# Patient Record
Sex: Female | Born: 1955 | Race: White | Hispanic: No | Marital: Married | Smoking: Former smoker
Health system: Southern US, Community
[De-identification: ages and names within clinical notes are randomized; demographics above are authoritative.]

## PROBLEM LIST (undated history)

## (undated) DIAGNOSIS — A498 Other bacterial infections of unspecified site: Secondary | ICD-10-CM

## (undated) DIAGNOSIS — A0472 Enterocolitis due to Clostridium difficile, not specified as recurrent: Secondary | ICD-10-CM

## (undated) HISTORY — DX: Other bacterial infections of unspecified site: A49.8

## (undated) HISTORY — PX: DILATION AND CURETTAGE OF UTERUS: SHX78

## (undated) HISTORY — DX: Enterocolitis due to Clostridium difficile, not specified as recurrent: A04.72

## (undated) HISTORY — PX: BREAST ENHANCEMENT SURGERY: SHX7

---

## 1998-02-16 ENCOUNTER — Other Ambulatory Visit: Admission: RE | Admit: 1998-02-16 | Discharge: 1998-02-16 | Payer: Self-pay | Admitting: Obstetrics and Gynecology

## 1999-12-28 ENCOUNTER — Other Ambulatory Visit: Admission: RE | Admit: 1999-12-28 | Discharge: 1999-12-28 | Payer: Self-pay | Admitting: Obstetrics and Gynecology

## 2001-01-01 ENCOUNTER — Other Ambulatory Visit: Admission: RE | Admit: 2001-01-01 | Discharge: 2001-01-01 | Payer: Self-pay | Admitting: Obstetrics and Gynecology

## 2001-03-21 ENCOUNTER — Encounter: Payer: Self-pay | Admitting: Internal Medicine

## 2001-03-21 ENCOUNTER — Encounter: Admission: RE | Admit: 2001-03-21 | Discharge: 2001-03-21 | Payer: Self-pay | Admitting: Internal Medicine

## 2002-12-03 ENCOUNTER — Other Ambulatory Visit: Admission: RE | Admit: 2002-12-03 | Discharge: 2002-12-03 | Payer: Self-pay | Admitting: Obstetrics and Gynecology

## 2002-12-13 ENCOUNTER — Encounter: Admission: RE | Admit: 2002-12-13 | Discharge: 2002-12-13 | Payer: Self-pay | Admitting: Sports Medicine

## 2004-03-03 ENCOUNTER — Ambulatory Visit (HOSPITAL_BASED_OUTPATIENT_CLINIC_OR_DEPARTMENT_OTHER): Admission: RE | Admit: 2004-03-03 | Discharge: 2004-03-03 | Payer: Self-pay | Admitting: Obstetrics and Gynecology

## 2004-03-03 ENCOUNTER — Encounter (INDEPENDENT_AMBULATORY_CARE_PROVIDER_SITE_OTHER): Payer: Self-pay | Admitting: Specialist

## 2004-03-03 ENCOUNTER — Ambulatory Visit (HOSPITAL_COMMUNITY): Admission: RE | Admit: 2004-03-03 | Discharge: 2004-03-03 | Payer: Self-pay | Admitting: Obstetrics and Gynecology

## 2004-11-29 ENCOUNTER — Encounter: Admission: RE | Admit: 2004-11-29 | Discharge: 2004-11-29 | Payer: Self-pay | Admitting: Obstetrics and Gynecology

## 2004-12-13 ENCOUNTER — Encounter: Admission: RE | Admit: 2004-12-13 | Discharge: 2004-12-13 | Payer: Self-pay | Admitting: Obstetrics and Gynecology

## 2005-02-14 HISTORY — PX: COLONOSCOPY: SHX174

## 2005-11-30 ENCOUNTER — Encounter: Admission: RE | Admit: 2005-11-30 | Discharge: 2005-11-30 | Payer: Self-pay | Admitting: Obstetrics and Gynecology

## 2006-12-13 ENCOUNTER — Encounter: Admission: RE | Admit: 2006-12-13 | Discharge: 2006-12-13 | Payer: Self-pay | Admitting: Obstetrics and Gynecology

## 2007-12-21 ENCOUNTER — Encounter: Admission: RE | Admit: 2007-12-21 | Discharge: 2007-12-21 | Payer: Self-pay | Admitting: Obstetrics and Gynecology

## 2008-08-02 ENCOUNTER — Emergency Department (HOSPITAL_COMMUNITY): Admission: EM | Admit: 2008-08-02 | Discharge: 2008-08-02 | Payer: Self-pay | Admitting: Family Medicine

## 2008-12-03 ENCOUNTER — Encounter: Admission: RE | Admit: 2008-12-03 | Discharge: 2008-12-03 | Payer: Self-pay | Admitting: Obstetrics and Gynecology

## 2009-12-04 ENCOUNTER — Encounter: Admission: RE | Admit: 2009-12-04 | Discharge: 2009-12-04 | Payer: Self-pay | Admitting: Obstetrics and Gynecology

## 2010-03-06 ENCOUNTER — Encounter: Payer: Self-pay | Admitting: Obstetrics and Gynecology

## 2010-07-02 NOTE — Op Note (Signed)
NAMELONDEN, Jacqueline Macias               ACCOUNT NO.:  [id number]   MEDICAL RECORD NO.:  [id number]          PATIENT TYPE:  AMB   LOCATION:  [facility]                         FACILITY:  [facility]   PHYSICIAN:  Sherry A. Dickstein, M.D.DATE OF BIRTH:  01-08-1956   DATE OF PROCEDURE:  03/03/2004  DATE OF DISCHARGE:                                 OPERATIVE REPORT   PREOPERATIVE DIAGNOSES:  1.  Menorrhagia.  2.  Endometrial polyps.   POSTOPERATIVE DIAGNOSES:  1.  Menorrhagia.  2.  Endometrial polyps.   PROCEDURE:  D&C hysteroscopy with resectoscope.   SURGEON:  Dr. Cordelia Pen A. Dickstein.   ANESTHESIA:  MAC.   INDICATIONS:  This is a 55 year old G2, P2-0-1-2 woman who has menstrual  periods every 30 days lasting 6 to 7 days with excessively heavy flow. The  patient changes two super tampons every two hours for two fill days. The  patient was found to be anemic at the time of her last exam with a  hemoglobin of 9.1. Ultrasound was performed revealing thickened endometrium  with hyperechoic masses within the endometrial cavity. Because of this, the  patient was brought to the operating room for [facility] hysteroscopy with  resectoscope.   FINDINGS:  Normal sized anteflexed uterus. No adnexa mass. Multiple polyps  in the endometrial cavity.   PROCEDURE:  The patient was brought into the operating room and given  adequate IV sedation. She was placed in the dorsal lithotomy position. Her  perineum and vagina were washed with Hibiclens. The patient was draped in a  sterile fashion. Specimen was placed within the vagina. Paracervical block  was administered with 1% Nesacaine. Anterior lip of the cervix was grasped  with a single-toothed tenaculum. Cervix was sounded and dilated with Pratt  dilators to a #31. Hysteroscope was easily introduced into the endometrial  cavity. Pictures were obtained. Using a double loop right angle resector,  all endometrial polyps were removed circumferentially. Then,  endometrial  resections were taken circumferentially. Adequate hemostasis was present  after small areas were cauterized. Pictures were obtained. All instruments  were removed from the vagina. The patient was taken out of the dorsal  lithotomy position. She was awakened. She was moved from the operating table  to a stretcher in stable condition. Complications were none. Estimated blood  loss less than 5 cc. Sorbitol differential minus 70 cc.   SPECIMENS:  1.  Endometrial polyps.  2.  Endometrial resections.     Sher   SAD/MEDQ  D:  03/03/2004  T:  03/03/2004  Job:  [id number]

## 2010-12-03 ENCOUNTER — Other Ambulatory Visit: Payer: Self-pay | Admitting: Obstetrics and Gynecology

## 2010-12-03 DIAGNOSIS — Z1231 Encounter for screening mammogram for malignant neoplasm of breast: Secondary | ICD-10-CM

## 2010-12-20 ENCOUNTER — Ambulatory Visit: Payer: Self-pay

## 2010-12-22 ENCOUNTER — Ambulatory Visit
Admission: RE | Admit: 2010-12-22 | Discharge: 2010-12-22 | Disposition: A | Discharge status: Home / Self Care | Payer: PRIVATE HEALTH INSURANCE | Source: Ambulatory Visit | Attending: Obstetrics and Gynecology | Admitting: Obstetrics and Gynecology

## 2010-12-22 DIAGNOSIS — Z1231 Encounter for screening mammogram for malignant neoplasm of breast: Secondary | ICD-10-CM

## 2011-01-03 ENCOUNTER — Other Ambulatory Visit: Payer: Self-pay | Admitting: Obstetrics and Gynecology

## 2011-01-03 DIAGNOSIS — R928 Other abnormal and inconclusive findings on diagnostic imaging of breast: Secondary | ICD-10-CM

## 2011-01-17 ENCOUNTER — Ambulatory Visit
Admission: RE | Admit: 2011-01-17 | Discharge: 2011-01-17 | Disposition: A | Discharge status: Home / Self Care | Payer: PRIVATE HEALTH INSURANCE | Source: Ambulatory Visit | Attending: Obstetrics and Gynecology | Admitting: Obstetrics and Gynecology

## 2011-01-17 DIAGNOSIS — R928 Other abnormal and inconclusive findings on diagnostic imaging of breast: Secondary | ICD-10-CM

## 2011-02-02 ENCOUNTER — Other Ambulatory Visit: Payer: Self-pay | Admitting: *Deleted

## 2011-02-03 ENCOUNTER — Other Ambulatory Visit: Payer: Self-pay | Admitting: Internal Medicine

## 2011-02-03 ENCOUNTER — Ambulatory Visit
Admission: RE | Admit: 2011-02-03 | Discharge: 2011-02-03 | Disposition: A | Discharge status: Home / Self Care | Payer: PRIVATE HEALTH INSURANCE | Source: Ambulatory Visit | Attending: *Deleted | Admitting: *Deleted

## 2011-02-03 DIAGNOSIS — K769 Liver disease, unspecified: Secondary | ICD-10-CM

## 2011-02-04 ENCOUNTER — Ambulatory Visit
Admission: RE | Admit: 2011-02-04 | Discharge: 2011-02-04 | Disposition: A | Discharge status: Home / Self Care | Payer: PRIVATE HEALTH INSURANCE | Source: Ambulatory Visit | Attending: Internal Medicine | Admitting: Internal Medicine

## 2011-02-04 DIAGNOSIS — K769 Liver disease, unspecified: Secondary | ICD-10-CM

## 2011-02-04 MED ORDER — GADOBENATE DIMEGLUMINE 529 MG/ML IV SOLN
10.0000 mL | Freq: Once | INTRAVENOUS | Status: AC | PRN
  Administered 2011-02-04: 10 mL via INTRAVENOUS

## 2011-02-05 ENCOUNTER — Other Ambulatory Visit: Payer: PRIVATE HEALTH INSURANCE

## 2011-11-25 ENCOUNTER — Other Ambulatory Visit: Payer: Self-pay | Admitting: Obstetrics and Gynecology

## 2011-11-25 DIAGNOSIS — Z1231 Encounter for screening mammogram for malignant neoplasm of breast: Secondary | ICD-10-CM

## 2011-11-25 DIAGNOSIS — Z9882 Breast implant status: Secondary | ICD-10-CM

## 2011-12-09 ENCOUNTER — Other Ambulatory Visit: Payer: Self-pay | Admitting: Obstetrics and Gynecology

## 2011-12-09 DIAGNOSIS — Z780 Asymptomatic menopausal state: Secondary | ICD-10-CM

## 2011-12-26 ENCOUNTER — Ambulatory Visit
Admission: RE | Admit: 2011-12-26 | Discharge: 2011-12-26 | Disposition: A | Discharge status: Home / Self Care | Payer: Self-pay | Source: Ambulatory Visit | Attending: Obstetrics and Gynecology | Admitting: Obstetrics and Gynecology

## 2011-12-26 DIAGNOSIS — Z9882 Breast implant status: Secondary | ICD-10-CM

## 2011-12-26 DIAGNOSIS — Z1231 Encounter for screening mammogram for malignant neoplasm of breast: Secondary | ICD-10-CM

## 2011-12-26 DIAGNOSIS — Z780 Asymptomatic menopausal state: Secondary | ICD-10-CM

## 2012-10-29 ENCOUNTER — Other Ambulatory Visit: Payer: Self-pay

## 2012-10-29 DIAGNOSIS — Z1231 Encounter for screening mammogram for malignant neoplasm of breast: Secondary | ICD-10-CM

## 2012-12-17 ENCOUNTER — Ambulatory Visit
Admission: RE | Admit: 2012-12-17 | Discharge: 2012-12-17 | Disposition: A | Discharge status: Home / Self Care | Payer: PRIVATE HEALTH INSURANCE | Source: Ambulatory Visit

## 2012-12-17 DIAGNOSIS — Z1231 Encounter for screening mammogram for malignant neoplasm of breast: Secondary | ICD-10-CM

## 2013-11-08 ENCOUNTER — Other Ambulatory Visit: Payer: Self-pay | Admitting: Obstetrics and Gynecology

## 2013-11-08 ENCOUNTER — Other Ambulatory Visit: Payer: Self-pay

## 2013-11-08 DIAGNOSIS — Z1231 Encounter for screening mammogram for malignant neoplasm of breast: Secondary | ICD-10-CM

## 2013-11-08 DIAGNOSIS — Z803 Family history of malignant neoplasm of breast: Secondary | ICD-10-CM

## 2013-11-08 DIAGNOSIS — M8580 Other specified disorders of bone density and structure, unspecified site: Secondary | ICD-10-CM

## 2013-12-26 ENCOUNTER — Other Ambulatory Visit: Payer: PRIVATE HEALTH INSURANCE

## 2014-01-15 ENCOUNTER — Ambulatory Visit
Admission: RE | Admit: 2014-01-15 | Discharge: 2014-01-15 | Disposition: A | Discharge status: Home / Self Care | Payer: 59 | Source: Ambulatory Visit

## 2014-01-15 ENCOUNTER — Ambulatory Visit
Admission: RE | Admit: 2014-01-15 | Discharge: 2014-01-15 | Disposition: A | Discharge status: Home / Self Care | Payer: 59 | Source: Ambulatory Visit | Attending: Obstetrics and Gynecology | Admitting: Obstetrics and Gynecology

## 2014-01-15 DIAGNOSIS — M8580 Other specified disorders of bone density and structure, unspecified site: Secondary | ICD-10-CM

## 2014-01-15 DIAGNOSIS — Z1231 Encounter for screening mammogram for malignant neoplasm of breast: Secondary | ICD-10-CM

## 2014-01-15 DIAGNOSIS — Z803 Family history of malignant neoplasm of breast: Secondary | ICD-10-CM

## 2014-10-22 ENCOUNTER — Telehealth: Payer: Self-pay | Admitting: Genetic Counselor

## 2014-10-22 NOTE — Telephone Encounter (Signed)
genetic f/u appt-s/w patient and gave appt for 09/22 @ 2 w/Kayla Joesph July

## 2014-11-06 ENCOUNTER — Encounter: Payer: Self-pay | Admitting: Genetic Counselor

## 2014-11-06 ENCOUNTER — Other Ambulatory Visit: Payer: 59

## 2014-11-06 ENCOUNTER — Ambulatory Visit (HOSPITAL_BASED_OUTPATIENT_CLINIC_OR_DEPARTMENT_OTHER): Payer: 59 | Admitting: Genetic Counselor

## 2014-11-06 DIAGNOSIS — Z803 Family history of malignant neoplasm of breast: Secondary | ICD-10-CM

## 2014-11-06 DIAGNOSIS — Z807 Family history of other malignant neoplasms of lymphoid, hematopoietic and related tissues: Secondary | ICD-10-CM

## 2014-11-06 DIAGNOSIS — Z315 Encounter for genetic counseling: Secondary | ICD-10-CM | POA: Diagnosis not present

## 2014-11-06 NOTE — Progress Notes (Signed)
REFERRING PROVIDER: Avon Gully, NP [place], [place] [address]  PRIMARY PROVIDER:  No primary care provider on file.  PRIMARY REASON FOR VISIT:  1. Family history of breast cancer in mother   2. Family history of breast cancer in female   3. Family history of lymphoma      HISTORY OF PRESENT ILLNESS:   Jacqueline Macias, a 59 y.o. female, was seen for a [place] cancer genetics consultation at the request of her provider due to a maternal family history of breast cancer.  Jacqueline Macias presents to clinic today to discuss the possibility of a hereditary predisposition to cancer, genetic testing, and to further clarify her future cancer risks, as well as potential cancer risks for family members.    Jacqueline Macias is a 59 y.o. female with no personal history of cancer.    CANCER HISTORY:   No history exists.     HORMONAL RISK FACTORS:  Menarche was at age 26.  First live birth at age 50.  OCP use for approximately 0 years.  Ovaries intact: yes.  Hysterectomy: no.  Menopausal status: postmenopausal.  HRT use: 7 years. Colonoscopy: yes; normal. Mammogram within the last year: yes. Number of breast biopsies: 0. Up to date with pelvic exams:  yes. Any excessive radiation exposure in the past:  no  No past medical history on file.  No past surgical history on file.  Social History   Social History  . Marital Status: Married    Spouse Name: N/A  . Number of Children: N/A  . Years of Education: N/A   Social History Main Topics  . Smoking status: Former Smoker -- 0.50 packs/day for 5 years    Types: Cigarettes    Quit date: 02/14/1977  . Smokeless tobacco: Never Used  . Alcohol Use: 2.4 oz/week    4 Glasses of wine per week     Comment: 2 glasses red wine on friday and saturday  . Drug Use: Not on file  . Sexual Activity: Not on file   Other Topics Concern  . Not on file   Social History Narrative  . No narrative on file     FAMILY HISTORY:  We  obtained a detailed, 4-generation family history.  Significant diagnoses are listed below: Family History  Problem Relation Age of Onset  . Breast cancer Mother 78  . Other Mother     TAH in 77s  . Lymphoma Father 77  . Breast cancer Maternal Aunt     dx. 76-78  . Heart attack Maternal Grandfather   . Dementia Paternal Grandmother   . Dementia Paternal Grandfather   . Breast cancer Maternal Aunt 80  . Breast cancer Other     Jacqueline Macias has twin sons, ages 70.  She has four full brothers, one of whom passed away at 51 in a boating accident.  Her other three brothers are currently in their 68s and have never had cancer.  Jacqueline Macias also has several nieces and nephews who have not had cancer to her knowledge.  Jacqueline Macias mother was diagnosed with breast cancer at 14 and she passed away following metastasis at the age of 24.  Jacqueline Macias reports that her mother had a hysterectomy in her 86s, following which she used estrogen.  Jacqueline Macias believes that the estrogen usage may have contributed to her mother's breast cancer.  Jacqueline Macias father was diagnosed with lymphoma at the age of 77 and he passed away at 7.  There is a maternal family history of breast cancer.  Jacqueline Macias mother had two full sisters and one full brother.  Both of Jacqueline Macias maternal aunts had breast cancer.  One aunt was diagnosed at 69 and passed away at 60; she had no children. The other aunt was diagnosed around 48 and passed away at 75.  This aunt had one son and two daughters.  One daughter, who is currently 105, was diagnosed with breast cancer at 29.  Jacqueline Macias reports that this cousin had BRCA testing in within the last 5 years that was negative.  This cousin has one son and one daughter who have not had cancer.  Jacqueline Macias maternal uncle has passed away, but she has not further information for him.  Jacqueline Macias maternal grandmother died at 8 and her grandfather died at 95 of a heart attack.  [name] reports that her grandmother reported a history of breast cancer for her own mother.  Jacqueline Macias father had one full sister who passed away at 42.  Jacqueline Macias reports that this aunt may have had cancer.  Jacqueline Macias has no information for her paternal first cousins.  Her paternal grandparents passed away with dementia in their 43s.  She is unaware of any additional cancer history on this side of the family.  Patient's maternal ancestors are of [place] descent, and paternal ancestors are of Caucasian/German descent. There is no reported Ashkenazi Jewish ancestry. There is no known consanguinity.  GENETIC COUNSELING ASSESSMENT: Jacqueline Macias is a 59 y.o. female with a family history of breast cancer which is somewhat suggestive of a hereditary breast cancer syndrome and predisposition to cancer. We, therefore, discussed and recommended the following at today's visit.   DISCUSSION: We reviewed the characteristics, features and inheritance patterns of hereditary cancer syndromes, particularly those caused by mutations within the BRCA1/2 genes. We also discussed genetic testing, including the appropriate family members to test, the process of testing, insurance coverage and turn-around-time for results. We discussed the implications of a negative, positive and/or variant of uncertain significant result. We recommended [name] pursue genetic testing for the 17-gene BreastNext Panel through [facility] ([place], [place]).  The BreastNext Panel offered by [facility] includes sequencing and deletion/duplication analysis for the following 17 genes: ATM, BARD1, BRCA1, BRCA2, BRIP1, CDH1, CHEK2, MRE11A, MUTYH, NBN, NF1, PALB2, PTEN, RAD50, RAD51C, RAD51D, and TP53.  Based on [name] family history of cancer, she meets medical criteria for genetic testing. Despite that she meets criteria, she may still have an out of pocket cost. We discussed that if her out of pocket cost for  testing is over $100, the laboratory will call and confirm whether she wants to proceed with testing.  If the out of pocket cost of testing is less than $100 she will be billed by the genetic testing laboratory.   Based on the patient's personal and family history, statistical model ([place])  and literature data were used to estimate her risk of developing breast cancer. These estimate her lifetime risk of developing breast cancer to be approximately 27.3%. This estimation does not take into account any genetic testing results.  The patient's lifetime breast cancer risk is a preliminary estimate based on available information using one of several models endorsed by the [place] (ACS). The ACS recommends consideration of breast MRI screening as an adjunct to mammography for patients at high risk (defined as 20% or greater lifetime risk). A more  detailed breast cancer risk assessment can be considered, if clinically indicated.   PLAN: After considering the risks, benefits, and limitations, Ms. Farabaugh  provided informed consent to pursue genetic testing and the blood sample was sent to [facility] for analysis of the 17-gene BreastNext Panel test. Results should be available within approximately 3-4 weeks' time, at which point they will be disclosed by telephone to Ms. Trevathan, as will any additional recommendations warranted by these results. Ms. Rocque will receive a summary of her genetic counseling visit and a copy of her results once available. This information will also be available in Epic. We encouraged Ms. Nash to remain in contact with cancer genetics annually so that we can continuously update the family history and inform her of any changes in cancer genetics and testing that may be of benefit for her family. Ms. Alarid questions were answered to her satisfaction today. Our contact information was provided should additional questions or concerns  arise.  Thank you for the referral and allowing [place] to share in the care of your patient.   Jeanine Luz, MS Genetic Counselor [email or web address] Phone: [phone]  The patient was seen for a total of 60 minutes in face-to-face genetic counseling.  This patient was discussed with Drs. Magrinat, Lindi Adie and/or Burr Medico who agrees with the above.    _______________________________________________________________________ For Office Staff:  Number of people involved in session: 1 Was an Intern/ student involved with case: no

## 2014-11-13 ENCOUNTER — Other Ambulatory Visit: Payer: Self-pay

## 2014-11-13 DIAGNOSIS — Z1231 Encounter for screening mammogram for malignant neoplasm of breast: Secondary | ICD-10-CM

## 2014-11-28 ENCOUNTER — Ambulatory Visit: Payer: Self-pay | Admitting: Genetic Counselor

## 2014-11-28 ENCOUNTER — Telehealth: Payer: Self-pay | Admitting: Genetic Counselor

## 2014-11-28 DIAGNOSIS — Z1379 Encounter for other screening for genetic and chromosomal anomalies: Secondary | ICD-10-CM

## 2014-11-28 NOTE — Telephone Encounter (Signed)
Discussed with Ms. Mish that her genetic test results were negative for pathogenic mutations within any of 17 genes that would cause her to be at an increased risk for breast or other related cancers.  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  At this point in time we treat this just like a negative result.  We discussed that Ms. Keach maternal first cousin, Rockie Neighbours, previous genetic test results may be helpful to further elucidate the significance of this VUS.  It sounds like Juliann Pulse may have had genetic testing in 2013 or before--a time when only BRCA1/2 testing would have been offered to her.  If this is the case, she would qualify for updated genetic testing.  Her test results will be able to shed some light because, if she has this VUS, this may make [place] more suspicious of hereditary cancer risks conferred by this gene, if she does not have this VUS, or tests positive for a pathogenic mutation, this may potentially give [place] an explanation for the history of cancer in the family.  Ms. Dry cousin lives in [place], but she will pass along this information to her and also give her the [email or web address] information for finding a Dietitian in her area.  We discussed that the Tyrer-Cuzick risk model calculated Ms. Nordquist lifetime breast cancer risk to be approximately 27.3%, which may qualify her for annual breast MRI.  She should discuss this potential option with her doctor.  She has concerns about hormonal impact on breast cancer risks and wonders if she should discontinue her estrogen patch.  She should further discuss this with her doctor, who may help her weight the pros and cons, as she seems like she might want to continue it.  Women in the family are considered to be at an increased risk for breast cancer based on the history, they should begin annual mammogram screening at the age of 23 or earlier, as indicated by their primary care providers.  Ms. Schwimmer and her  family members are welcome to call me with any further questions.

## 2014-11-30 ENCOUNTER — Ambulatory Visit (INDEPENDENT_AMBULATORY_CARE_PROVIDER_SITE_OTHER): Payer: 59

## 2014-11-30 ENCOUNTER — Ambulatory Visit (INDEPENDENT_AMBULATORY_CARE_PROVIDER_SITE_OTHER): Payer: 59 | Admitting: Internal Medicine

## 2014-11-30 VITALS — BP 160/92 | HR 61 | Temp 98.0°F | Resp 16 | Ht 66.5 in | Wt 128.0 lb

## 2014-11-30 DIAGNOSIS — R1011 Right upper quadrant pain: Secondary | ICD-10-CM

## 2014-11-30 DIAGNOSIS — R6883 Chills (without fever): Secondary | ICD-10-CM

## 2014-11-30 DIAGNOSIS — R109 Unspecified abdominal pain: Secondary | ICD-10-CM

## 2014-11-30 DIAGNOSIS — R112 Nausea with vomiting, unspecified: Secondary | ICD-10-CM

## 2014-11-30 DIAGNOSIS — M546 Pain in thoracic spine: Secondary | ICD-10-CM | POA: Diagnosis not present

## 2014-11-30 LAB — POCT CBC
Granulocyte percent: 68.3 %G (ref 37–80)
HEMATOCRIT: 42.3 % (ref 37.7–47.9)
Hemoglobin: 14.2 g/dL (ref 12.2–16.2)
LYMPH, POC: 1.4 (ref 0.6–3.4)
MCH, POC: 30.8 pg (ref 27–31.2)
MCHC: 33.7 g/dL (ref 31.8–35.4)
MCV: 91.3 fL (ref 80–97)
MID (cbc): 0.5 (ref 0–0.9)
MPV: 7 fL (ref 0–99.8)
POC GRANULOCYTE: 4.1 (ref 2–6.9)
POC LYMPH %: 23.8 % (ref 10–50)
POC MID %: 7.9 % (ref 0–12)
Platelet Count, POC: 242 10*3/uL (ref 142–424)
RBC: 4.63 M/uL (ref 4.04–5.48)
RDW, POC: 13.6 %
WBC: 6 10*3/uL (ref 4.6–10.2)

## 2014-11-30 LAB — POCT URINALYSIS DIP (MANUAL ENTRY)
Bilirubin, UA: NEGATIVE
GLUCOSE UA: NEGATIVE
Leukocytes, UA: NEGATIVE
Nitrite, UA: NEGATIVE
PH UA: 7
Protein Ur, POC: 30 — AB
SPEC GRAV UA: 1.02
UROBILINOGEN UA: 0.2

## 2014-11-30 LAB — POC MICROSCOPIC URINALYSIS (UMFC): MUCUS RE: ABSENT

## 2014-11-30 MED ORDER — ONDANSETRON 4 MG PO TBDP
8.0000 mg | ORAL_TABLET | Freq: Once | ORAL | Status: AC
  Administered 2014-11-30: 8 mg via ORAL

## 2014-11-30 MED ORDER — METHOCARBAMOL 750 MG PO TABS: 750.0000 mg | ORAL_TABLET | Freq: Three times a day (TID) | ORAL | Status: DC | PRN

## 2014-11-30 MED ORDER — ONDANSETRON HCL 8 MG PO TABS: 8.0000 mg | ORAL_TABLET | Freq: Three times a day (TID) | ORAL | Status: DC | PRN

## 2014-11-30 MED ORDER — HYDROCODONE-ACETAMINOPHEN 5-325 MG PO TABS: 1.0000 | ORAL_TABLET | Freq: Four times a day (QID) | ORAL | Status: DC | PRN

## 2014-11-30 NOTE — Progress Notes (Signed)
Patient ID: Jacqueline Macias, female   DOB: 02/06/1956, 59 y.o.   MRN: [id number]   11/30/2014 at 5:28 PM  Jacqueline Macias / DOB: [birth year] / MRN: [id number]  Problem list reviewed and updated by me where necessary.   SUBJECTIVE  Jacqueline Macias is a 59 y.o. well appearing female presenting for the chief complaint of right flank pain, right thorax pain, nausea, vomiting, chills. She has no abdominal pain, no hx of stones any where No fever. She is very active and lives healthy.   Has no diseases on no medications.No sob, no angina and negative heart risk factors.  She  has no past medical history on file.    Medications reviewed and updated by myself where necessary, and exist elsewhere in the encounter.   Jacqueline Macias has No Known Allergies. She  reports that she quit smoking about 37 years ago. Her smoking use included Cigarettes. She has a 2.5 pack-year smoking history. She has never used smokeless tobacco. She reports that she drinks about 2.4 oz of alcohol per week. She  has no sexual activity history on file. The patient  has no past surgical history on file.  Her family history includes Breast cancer in her maternal aunt and other; Breast cancer (age of onset: 37) in her mother; Breast cancer (age of onset: 84) in her maternal aunt; Dementia in her paternal grandfather and paternal grandmother; Heart attack in her maternal grandfather; Lymphoma (age of onset: 70) in her father; Other in her mother.  Review of Systems  Constitutional: Positive for chills. Negative for fever and diaphoresis.  Respiratory: Negative for cough and shortness of breath.   Cardiovascular: Positive for chest pain. Negative for palpitations.  Gastrointestinal: Positive for nausea, vomiting and constipation. Negative for heartburn, abdominal pain and diarrhea.  Genitourinary: Negative.   Musculoskeletal: Positive for myalgias and back pain.  Skin: Negative for rash.  Neurological: Negative for dizziness, sensory  change, focal weakness and headaches.    OBJECTIVE  Her  height is 5' 6.5" (1.689 m) and weight is 128 lb (58.06 kg). Her oral temperature is 98 F (36.7 C). Her blood pressure is 160/92 and her pulse is 61. Her respiration is 16 and oxygen saturation is 95%.  The patient's body mass index is 20.35 kg/(m^2).  Physical Exam  Constitutional: She is oriented to person, place, and time. She appears well-developed and well-nourished.  HENT:  Head: Normocephalic.  Eyes: Conjunctivae are normal. No scleral icterus.  Neck: Normal range of motion.  Cardiovascular: Normal rate.   Respiratory: Effort normal. No tachypnea. No respiratory distress. She has no decreased breath sounds. She has no wheezes. She has no rales.    GI: Soft. Bowel sounds are normal. She exhibits no distension and no mass. There is no tenderness. There is no rebound and no guarding.  Musculoskeletal: Normal range of motion.  Neurological: She is alert and oriented to person, place, and time. Coordination normal.  Skin: Skin is warm and dry.  Psychiatric: She has a normal mood and affect.    Results for orders placed or performed in visit on 11/30/14 (from the past 24 hour(s))  POCT CBC     Status: None   Collection Time: 11/30/14  5:24 PM  Result Value Ref Range   WBC 6.0 4.6 - 10.2 K/uL   Lymph, poc 1.4 0.6 - 3.4   POC LYMPH PERCENT 23.8 10 - 50 %L   MID (cbc) 0.5 0 - 0.9   POC MID %  7.9 0 - 12 %M   POC Granulocyte 4.1 2 - 6.9   Granulocyte percent 68.3 37 - 80 %G   RBC 4.63 4.04 - 5.48 M/uL   Hemoglobin 14.2 12.2 - 16.2 g/dL   HCT, POC 42.3 37.7 - 47.9 %   MCV 91.3 80 - 97 fL   MCH, POC 30.8 27 - 31.2 pg   MCHC 33.7 31.8 - 35.4 g/dL   RDW, POC 13.6 %   Platelet Count, POC 242 142 - 424 K/uL   MPV 7.0 0 - 99.8 fL  POCT Microscopic Urinalysis ([facility])     Status: Abnormal   Collection Time: 11/30/14  5:24 PM  Result Value Ref Range   WBC,UR,HPF,POC None None WBC/hpf   RBC,UR,HPF,POC None None RBC/hpf    Bacteria None None   Mucus Absent Absent   Epithelial Cells, UR Per Microscopy Few (A) None cells/hpf   Amorphous present   POCT urinalysis dipstick     Status: Abnormal   Collection Time: 11/30/14  5:26 PM  Result Value Ref Range   Color, UA yellow yellow   Clarity, UA cloudy (A) clear   Glucose, UA negative negative   Bilirubin, UA negative negative   Ketones, POC UA small (15) (A) negative   Spec Grav, UA 1.020    Blood, UA trace-intact (A) negative   pH, UA 7.0    Protein Ur, POC =30 (A) negative   Urobilinogen, UA 0.2    Nitrite, UA Negative Negative   Leukocytes, UA Negative Negative   [facility] reading (PRIMARY) by  Dr.Kore Madlock normal chest and kub.    ASSESSMENT & PLAN  Jacqueline Macias was seen today for back pain, nausea and emesis.  Diagnoses and all orders for this visit:  Acute right flank pain -     POCT CBC -     POCT Microscopic Urinalysis ([facility]) -     POCT urinalysis dipstick -     Cancel: POCT SEDIMENTATION RATE -     Comprehensive metabolic panel -     DG Chest 2 View; Future -     DG Abd 1 View; Future -     ondansetron (ZOFRAN-ODT) disintegrating tablet 8 mg; Take 2 tablets (8 mg total) by mouth once. -     Sedimentation rate  Non-intractable vomiting with nausea, unspecified vomiting type -     POCT CBC -     POCT Microscopic Urinalysis ([facility]) -     POCT urinalysis dipstick -     Cancel: POCT SEDIMENTATION RATE -     Comprehensive metabolic panel -     DG Chest 2 View; Future -     DG Abd 1 View; Future -     ondansetron (ZOFRAN-ODT) disintegrating tablet 8 mg; Take 2 tablets (8 mg total) by mouth once.  Chills -     POCT CBC -     POCT Microscopic Urinalysis ([facility]) -     POCT urinalysis dipstick -     Cancel: POCT SEDIMENTATION RATE -     Comprehensive metabolic panel -     DG Chest 2 View; Future -     DG Abd 1 View; Future -     ondansetron (ZOFRAN-ODT) disintegrating tablet 8 mg; Take 2 tablets (8 mg total) by mouth once.  Right-sided thoracic back  pain -     POCT CBC -     POCT Microscopic Urinalysis ([facility]) -     POCT urinalysis dipstick -     Cancel:  POCT SEDIMENTATION RATE -     Comprehensive metabolic panel -     DG Chest 2 View; Future -     DG Abd 1 View; Future -     ondansetron (ZOFRAN-ODT) disintegrating tablet 8 mg; Take 2 tablets (8 mg total) by mouth once. -     Sedimentation rate

## 2014-11-30 NOTE — Patient Instructions (Signed)
Nausea and Vomiting Nausea is a sick feeling that often comes before throwing up (vomiting). Vomiting is a reflex where stomach contents come out of your mouth. Vomiting can cause severe loss of body fluids (dehydration). Children and elderly adults can become dehydrated quickly, especially if they also have diarrhea. Nausea and vomiting are symptoms of a condition or disease. It is important to find the cause of your symptoms. CAUSES   Direct irritation of the stomach lining. This irritation can result from increased acid production (gastroesophageal reflux disease), infection, food poisoning, taking certain medicines (such as nonsteroidal anti-inflammatory drugs), alcohol use, or tobacco use.  Signals from the brain.These signals could be caused by a headache, heat exposure, an inner ear disturbance, increased pressure in the brain from injury, infection, a tumor, or a concussion, pain, emotional stimulus, or metabolic problems.  An obstruction in the gastrointestinal tract (bowel obstruction).  Illnesses such as diabetes, hepatitis, gallbladder problems, appendicitis, kidney problems, cancer, sepsis, atypical symptoms of a heart attack, or eating disorders.  Medical treatments such as chemotherapy and radiation.  Receiving medicine that makes you sleep (general anesthetic) during surgery. DIAGNOSIS Your caregiver may ask for tests to be done if the problems do not improve after a few days. Tests may also be done if symptoms are severe or if the reason for the nausea and vomiting is not clear. Tests may include:  Urine tests.  Blood tests.  Stool tests.  Cultures (to look for evidence of infection).  X-rays or other imaging studies. Test results can help your caregiver make decisions about treatment or the need for additional tests. TREATMENT You need to stay well hydrated. Drink frequently but in small amounts.You may wish to drink water, sports drinks, clear broth, or eat frozen  ice pops or gelatin dessert to help stay hydrated.When you eat, eating slowly may help prevent nausea.There are also some antinausea medicines that may help prevent nausea. HOME CARE INSTRUCTIONS   Take all medicine as directed by your caregiver.  If you do not have an appetite, do not force yourself to eat. However, you must continue to drink fluids.  If you have an appetite, eat a normal diet unless your caregiver tells you differently.  Eat a variety of complex carbohydrates (rice, wheat, potatoes, bread), lean meats, yogurt, fruits, and vegetables.  Avoid high-fat foods because they are more difficult to digest.  Drink enough water and fluids to keep your urine clear or pale yellow.  If you are dehydrated, ask your caregiver for specific rehydration instructions. Signs of dehydration may include:  Severe thirst.  Dry lips and mouth.  Dizziness.  Dark urine.  Decreasing urine frequency and amount.  Confusion.  Rapid breathing or pulse. SEEK IMMEDIATE MEDICAL CARE IF:   You have blood or brown flecks (like coffee grounds) in your vomit.  You have black or bloody stools.  You have a severe headache or stiff neck.  You are confused.  You have severe abdominal pain.  You have chest pain or trouble breathing.  You do not urinate at least once every 8 hours.  You develop cold or clammy skin.  You continue to vomit for longer than 24 to 48 hours.  You have a fever. MAKE SURE YOU:   Understand these instructions.  Will watch your condition.  Will get help right away if you are not doing well or get worse.   This information is not intended to replace advice given to you by your health care provider. Make sure  you discuss any questions you have with your health care provider.   Document Released: 01/31/2005 Document Revised: 04/25/2011 Document Reviewed: 06/30/2010 Elsevier Interactive Patient Education 2016 Elsevier Inc. Back Pain, Adult Back pain is very  common in adults.The cause of back pain is rarely dangerous and the pain often gets better over time.The cause of your back pain may not be known. Some common causes of back pain include:  Strain of the muscles or ligaments supporting the spine.  Wear and tear (degeneration) of the spinal disks.  Arthritis.  Direct injury to the back. For many people, back pain may return. Since back pain is rarely dangerous, most people can learn to manage this condition on their own. HOME CARE INSTRUCTIONS Watch your back pain for any changes. The following actions may help to lessen any discomfort you are feeling:  Remain active. It is stressful on your back to sit or stand in one place for long periods of time. Do not sit, drive, or stand in one place for more than 30 minutes at a time. Take short walks on even surfaces as soon as you are able.Try to increase the length of time you walk each day.  Exercise regularly as directed by your health care provider. Exercise helps your back heal faster. It also helps avoid future injury by keeping your muscles strong and flexible.  Do not stay in bed.Resting more than 1-2 days can delay your recovery.  Pay attention to your body when you bend and lift. The most comfortable positions are those that put less stress on your recovering back. Always use proper lifting techniques, including:  Bending your knees.  Keeping the load close to your body.  Avoiding twisting.  Find a comfortable position to sleep. Use a firm mattress and lie on your side with your knees slightly bent. If you lie on your back, put a pillow under your knees.  Avoid feeling anxious or stressed.Stress increases muscle tension and can worsen back pain.It is important to recognize when you are anxious or stressed and learn ways to manage it, such as with exercise.  Take medicines only as directed by your health care provider. Over-the-counter medicines to reduce pain and inflammation  are often the most helpful.Your health care provider may prescribe muscle relaxant drugs.These medicines help dull your pain so you can more quickly return to your normal activities and healthy exercise.  Apply ice to the injured area:  Put ice in a plastic bag.  Place a towel between your skin and the bag.  Leave the ice on for 20 minutes, 2-3 times a day for the first 2-3 days. After that, ice and heat may be alternated to reduce pain and spasms.  Maintain a healthy weight. Excess weight puts extra stress on your back and makes it difficult to maintain good posture. SEEK MEDICAL CARE IF:  You have pain that is not relieved with rest or medicine.  You have increasing pain going down into the legs or buttocks.  You have pain that does not improve in one week.  You have night pain.  You lose weight.  You have a fever or chills. SEEK IMMEDIATE MEDICAL CARE IF:   You develop new bowel or bladder control problems.  You have unusual weakness or numbness in your arms or legs.  You develop nausea or vomiting.  You develop abdominal pain.  You feel faint.   This information is not intended to replace advice given to you by your health care provider.  Make sure you discuss any questions you have with your health care provider.   Document Released: 01/31/2005 Document Revised: 02/21/2014 Document Reviewed: 06/04/2013 Elsevier Interactive Patient Education [employer].

## 2014-12-01 ENCOUNTER — Telehealth: Payer: Self-pay | Admitting: Family Medicine

## 2014-12-01 LAB — COMPREHENSIVE METABOLIC PANEL
ALT: 30 U/L — AB (ref 6–29)
AST: 40 U/L — ABNORMAL HIGH (ref 10–35)
Albumin: 4.9 g/dL (ref 3.6–5.1)
Alkaline Phosphatase: 81 U/L (ref 33–130)
BUN: 20 mg/dL (ref 7–25)
CHLORIDE: 102 mmol/L (ref 98–110)
CO2: 31 mmol/L (ref 20–31)
CREATININE: 0.81 mg/dL (ref 0.50–1.05)
Calcium: 10.2 mg/dL (ref 8.6–10.4)
GLUCOSE: 101 mg/dL — AB (ref 65–99)
POTASSIUM: 4.1 mmol/L (ref 3.5–5.3)
SODIUM: 138 mmol/L (ref 135–146)
TOTAL PROTEIN: 7.6 g/dL (ref 6.1–8.1)
Total Bilirubin: 0.7 mg/dL (ref 0.2–1.2)

## 2014-12-01 LAB — SEDIMENTATION RATE: Sed Rate: 4 mm/hr (ref 0–30)

## 2014-12-01 NOTE — Telephone Encounter (Signed)
Came in and saw Dr. Elder Cyphers yesterday, she noticed Saturday that she lost her tooth crown, she must have swallowed it and wanted to know if that could be causing her back pain? Didn't think about it until today. Please advise

## 2014-12-01 NOTE — Telephone Encounter (Signed)
Radiologist read of KUB: 21mm left pelvic calcification, possibly adnexal, which suggestspossibility of dermoid. Consider CT abdomen and pelvis.  After review of radiograph, left pelvic calcification could very well be a tooth crown. Pt states she is doing ok - symptoms have not worsened. She is having intermittent back pain and nausea. She has not had a BM since being seen yesterday. She will take miralax BID until loose stools and then once a day thereafter for next 1 week. She will return to clinic if sx worsen. She will return in 1-2 weeks for repeat KUB to make sure calcification has resolved.

## 2014-12-02 ENCOUNTER — Ambulatory Visit (INDEPENDENT_AMBULATORY_CARE_PROVIDER_SITE_OTHER): Payer: 59 | Admitting: Internal Medicine

## 2014-12-02 VITALS — BP 130/80 | HR 72 | Temp 98.1°F | Resp 16 | Ht 66.0 in | Wt 129.0 lb

## 2014-12-02 DIAGNOSIS — K5900 Constipation, unspecified: Secondary | ICD-10-CM

## 2014-12-02 DIAGNOSIS — G4700 Insomnia, unspecified: Secondary | ICD-10-CM

## 2014-12-02 DIAGNOSIS — M62830 Muscle spasm of back: Secondary | ICD-10-CM | POA: Diagnosis not present

## 2014-12-02 MED ORDER — ZOLPIDEM TARTRATE 10 MG PO TABS: 10.0000 mg | ORAL_TABLET | Freq: Every evening | ORAL | Status: DC | PRN

## 2014-12-02 MED ORDER — PEG 3350-KCL-NABCB-NACL-NASULF 236 G PO SOLR: 4000.0000 mL | Freq: Once | ORAL | Status: DC

## 2014-12-02 NOTE — Patient Instructions (Signed)

## 2014-12-02 NOTE — Progress Notes (Signed)
Patient ID: Jacqueline Macias, female   DOB: 07/29/1955, 59 y.o.   MRN: [id number]   12/02/2014 at 10:18 AM  Jacqueline Macias / DOB: [birth year] / MRN: [id number]  Problem list reviewed and updated by me where necessary.   SUBJECTIVE  Jacqueline Macias is a 59 y.o. well appearing female presenting for the chief complaint of persistent night back spasm and pain with no sleep. Vomiting has stopped She is eating some..No fever, does not look sick. No BM for long time.Feels like very localized muscle spasms at thoraco-lumbar junction just right of spine.Can reproduce with palpation. She also swallowed her crown  , which probably explains her calcified pelvic mass. We will observe.   She  has no past medical history on file.    Medications reviewed and updated by myself where necessary, and exist elsewhere in the encounter.   Jacqueline Macias has No Known Allergies. She  reports that she quit smoking about 37 years ago. Her smoking use included Cigarettes. She has a 2.5 pack-year smoking history. She has never used smokeless tobacco. She reports that she drinks about 2.4 oz of alcohol per week. She  has no sexual activity history on file. The patient  has no past surgical history on file.  Her family history includes Breast cancer in her maternal aunt and other; Breast cancer (age of onset: 31) in her mother; Breast cancer (age of onset: 47) in her maternal aunt; Dementia in her paternal grandfather and paternal grandmother; Heart attack in her maternal grandfather; Lymphoma (age of onset: 6) in her father; Other in her mother.  Review of Systems  Constitutional: Negative for fever.  Respiratory: Negative for shortness of breath.   Cardiovascular: Negative for chest pain.  Gastrointestinal: Negative for nausea.  Skin: Negative for rash.  Neurological: Negative for dizziness and headaches.    OBJECTIVE  Her  height is 5\' 6"  (1.676 m) and weight is 129 lb (58.514 kg). Her oral temperature is 98.1 F  (36.7 C). Her blood pressure is 130/80 and her pulse is 72. Her respiration is 16 and oxygen saturation is 99%.  The patient's body mass index is 20.83 kg/(m^2).  Physical Exam  Constitutional: She is oriented to person, place, and time. She appears well-developed and well-nourished.  HENT:  Head: Normocephalic.  Eyes: Conjunctivae are normal. No scleral icterus.  Neck: Normal range of motion.  Cardiovascular: Normal rate.   Respiratory: Effort normal.  GI: She exhibits no distension.  Musculoskeletal: Normal range of motion. She exhibits tenderness.       Lumbar back: She exhibits tenderness, pain and spasm. She exhibits normal range of motion, no bony tenderness, no swelling, no edema, no deformity and normal pulse.       Back:  Palpation reproduces pain.  Neurological: She is alert and oriented to person, place, and time. She has normal strength and normal reflexes. She displays no atrophy, no tremor and normal reflexes. No cranial nerve deficit or sensory deficit. She exhibits normal muscle tone. Coordination and gait normal.  Skin: Skin is warm and dry. No rash noted.  Psychiatric: She has a normal mood and affect.    No results found for this or any previous visit (from the past 24 hour(s)).  ASSESSMENT & PLAN  There are no diagnoses linked to this encounter.

## 2014-12-03 ENCOUNTER — Telehealth: Payer: Self-pay

## 2014-12-03 DIAGNOSIS — R1900 Intra-abdominal and pelvic swelling, mass and lump, unspecified site: Secondary | ICD-10-CM

## 2014-12-03 DIAGNOSIS — R9389 Abnormal findings on diagnostic imaging of other specified body structures: Secondary | ICD-10-CM

## 2014-12-03 NOTE — Telephone Encounter (Signed)
Patient is calling in regards the x-ray that was done. She would like more detailes about what was found because she thought she swolled a crown and it turned out to be a chipped tooth. Please call patient with details. Patient asked if she could receive a call back today. [phone]

## 2014-12-04 NOTE — Telephone Encounter (Signed)
I am not sure what we can do about this.    Ezekiel Slocumb, PA-C at 12/01/2014 4:50 PM     Status: Signed       Expand All Collapse All   Radiologist read of KUB: 36mm left pelvic calcification, possibly adnexal, which suggestspossibility of dermoid. Consider CT abdomen and pelvis.  After review of radiograph, left pelvic calcification could very well be a tooth crown. Pt states she is doing ok - symptoms have not worsened. She is having intermittent back pain and nausea. She has not had a BM since being seen yesterday. She will take miralax BID until loose stools and then once a day thereafter for next 1 week. She will return to clinic if sx worsen. She will return in 1-2 weeks for repeat KUB to make sure calcification has resolved.

## 2014-12-04 NOTE — Telephone Encounter (Signed)
Pt states she went to dentist and dentist told her she did not lose her crown. She wants to proceed with CT abdomen pelvis - order placed. She is otherwise feeling better after taking colon cleanse. She wants to come by tomorrow 10/21 to get copy of her xray.

## 2014-12-04 NOTE — Telephone Encounter (Signed)
Patient states she did not swollow a crown.  The spot on the x-ray must be something more serious.   Please call [phone]

## 2014-12-05 ENCOUNTER — Ambulatory Visit
Admission: RE | Admit: 2014-12-05 | Discharge: 2014-12-05 | Disposition: A | Discharge status: Home / Self Care | Payer: 59 | Source: Ambulatory Visit | Attending: Obstetrics and Gynecology | Admitting: Obstetrics and Gynecology

## 2014-12-05 ENCOUNTER — Other Ambulatory Visit: Payer: Self-pay | Admitting: Obstetrics and Gynecology

## 2014-12-05 DIAGNOSIS — R112 Nausea with vomiting, unspecified: Secondary | ICD-10-CM

## 2014-12-05 DIAGNOSIS — R6883 Chills (without fever): Secondary | ICD-10-CM

## 2014-12-05 DIAGNOSIS — R109 Unspecified abdominal pain: Secondary | ICD-10-CM

## 2014-12-05 DIAGNOSIS — R319 Hematuria, unspecified: Secondary | ICD-10-CM

## 2014-12-05 DIAGNOSIS — Z1379 Encounter for other screening for genetic and chromosomal anomalies: Secondary | ICD-10-CM | POA: Insufficient documentation

## 2014-12-05 MED ORDER — IOPAMIDOL (ISOVUE-300) INJECTION 61%
100.0000 mL | Freq: Once | INTRAVENOUS | Status: AC | PRN
  Administered 2014-12-05: 100 mL via INTRAVENOUS

## 2014-12-05 NOTE — Telephone Encounter (Signed)
Patient called to see if a copy of her x-rays is ready for pickup. She mentioned to Bennett Scrape, PA-C that she wants to come pick them up on 10/21 and this documented in the notes below. Patient states that she will be by the office at 9:30am to get them.

## 2014-12-05 NOTE — Progress Notes (Signed)
GENETIC TEST RESULTS  HPI: Ms. Jacqueline Macias was previously seen in the [place] clinic due to a maternal family history of breast cancer and concerns regarding a hereditary predisposition to cancer. Please refer to our prior cancer genetics clinic note from November 06, 2014 for more information regarding Ms. Jacqueline Macias's medical, social and family histories, and our assessment and recommendations, at the time. Ms. Jacqueline Macias recent genetic test results were disclosed to her, as were recommendations warranted by these results. These results and recommendations are discussed in more detail below.  GENETIC TEST RESULTS: At the time of Ms. Jacqueline Macias visit on 11/06/14, we recommended she pursue genetic testing of the 17-gene BreastNext Panel through [employer] [facility] [employer] [facility], [place]).  The BreastNext Panel performed by [facility] includes sequencing and deletion/duplication analysis of the following genes: ATM, BARD1, BRCA1, BRCA2, BRIP1, CDH1, CHEK2, MRE11A, MUTYH, NBN, NF1, PALB2, PTEN, RAD50, RAD51C, RAD51D, and TP53.  Those results are now back, the report date for which is November 27, 2014.  Genetic testing was normal, and did not reveal a deleterious mutation in these genes.  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  The test report will be scanned into EPIC and will be located under the Results Review tab in the Pathology>Molecular Pathology section.   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 At this time, it is unknown if this VUS is associated with an increased risk for cancer or if this is a normal finding. Since this VUS result is uncertain, it cannot help guide screening recommendations, and family members should not be tested for this VUS to help define their own cancer risks.  Also, we all have variants within our genes that make [place] unique  individuals--most of these variants are benign.  Thus, we treat this VUS as a negative result.   With time, we suspect the lab will reclassify this variant and when they do, we will try to re-contact Ms. Jacqueline Macias to discuss the reclassification further.  We also encouraged Ms. Jacqueline Macias to contact us in a year or two to obtain an update on the status of this VUS.  We discussed with Ms. Jacqueline Macias that since the current genetic testing is not perfect, it is possible there may be a gene mutation in one of these genes that current testing cannot detect, but that chance is small. We also discussed, that it is possible that another gene that has not yet been discovered, or that we have not yet tested, is responsible for the cancer diagnoses in the family, and it is, therefore, important to remain in touch with cancer genetics in the future so that we can continue to offer Ms. Jacqueline Macias the most up to date genetic testing.   CANCER SCREENING RECOMMENDATIONS: While we still do not have an explanation for the family history of cancer, this normal result is reassuring and indicates that Ms. Jacqueline Macias does not likely have an increased risk of cancer due to a mutation in one of these genes.  However, there are multiple breast cancer diagnoses on the maternal family (though most are at later ages of diagnosis).  Thus, this negative result could also mean that there is some hereditary cancer risk in the family that Ms. Jacqueline Macias herself did not inherit, or it could also mean that we just are not testing the right genes at this point in time.  Thus, this  result cannot necessarily rule out a genetic cause for cancer in the family, and it would be helpful to test additional family members to better determine the personal familial cancer risks.  We, therefore, recommended  Ms. Jacqueline Macias continue to follow the cancer screening guidelines provided by her primary healthcare providers.   The Tyrer-Cuzick risk model was used to calculate a  lifetime breast cancer risk for Ms. Jacqueline Macias.  This value is based upon family history, personal history, etc.  Ms. Jacqueline Macias lifetime breast cancer risk was estimated to be approximately 27.3%.  Since this value is higher than 20%, according to the [place], Ms. Jacqueline Macias would qualify for annual breast MRI in addition to annual mammogram screening.  She should discuss this potential option with her doctor.  She has concerns about hormonal impact on breast cancer risks and wonders if she should discontinue her estrogen patch.  She should further discuss this with her doctor, who may help her weigh the pros and cons, as she seems like she might want to continue it.    RECOMMENDATIONS FOR FAMILY MEMBERS: Women in this family might be at some increased risk of developing cancer, over the general population risk, simply due to the family history of cancer. We recommended women in this family have a yearly mammogram beginning at age 45, or 73 years younger than the earliest onset of cancer, an an annual clinical breast exam, and perform monthly breast self-exams. Women in this family should also have a gynecological exam as recommended by their primary provider. All family members should have a colonoscopy by age 83.   We discussed that Ms. Jacqueline Macias maternal first cousin, Jacqueline Macias, previous genetic test results may be helpful to further elucidate the significance of this NBN gene VUS.  It sounds like Jacqueline Macias may have had genetic testing in 2013 or before--a time when only BRCA1/2 testing would have been offered to her.  If this is the case, she would qualify for updated genetic testing.  Her test results will be able to shed some light because, if she has this VUS, this may make [place] more suspicious of hereditary cancer risks conferred by this gene, if she does not have this VUS, or tests positive for a pathogenic mutation, this may potentially give [place] an explanation for the history of cancer in the  family.  Ms. Jacqueline Macias cousin lives in [place], but she will pass along this information to her and also give her the [email or web address] information for finding a Dietitian in her area.  FOLLOW-UP: Lastly, we discussed with Ms. Jacqueline Macias that cancer genetics is a rapidly advancing field and it is possible that new genetic tests will be appropriate for her and/or her family members in the future. We encouraged her to remain in contact with cancer genetics on an annual basis so we can update her personal and family histories and let her know of advances in cancer genetics that may benefit this family.   Our contact number was provided. Ms. Jacqueline Macias questions were answered to her satisfaction, and she knows she is welcome to call us at anytime with additional questions or concerns.   Jeanine Luz, MS Genetic Counselor [email or web address] Phone: [phone]

## 2014-12-05 NOTE — Telephone Encounter (Signed)
Copy of Copy of message sent to X-ray

## 2014-12-24 ENCOUNTER — Ambulatory Visit: Payer: 59

## 2015-01-16 ENCOUNTER — Ambulatory Visit
Admission: RE | Admit: 2015-01-16 | Discharge: 2015-01-16 | Disposition: A | Discharge status: Home / Self Care | Payer: 59 | Source: Ambulatory Visit

## 2015-01-16 DIAGNOSIS — Z1231 Encounter for screening mammogram for malignant neoplasm of breast: Secondary | ICD-10-CM

## 2015-01-20 ENCOUNTER — Encounter (HOSPITAL_COMMUNITY): Payer: Self-pay

## 2015-12-15 ENCOUNTER — Other Ambulatory Visit: Payer: Self-pay | Admitting: Obstetrics and Gynecology

## 2015-12-15 DIAGNOSIS — M8580 Other specified disorders of bone density and structure, unspecified site: Secondary | ICD-10-CM

## 2015-12-15 DIAGNOSIS — Z1231 Encounter for screening mammogram for malignant neoplasm of breast: Secondary | ICD-10-CM

## 2016-01-26 ENCOUNTER — Other Ambulatory Visit: Payer: 59

## 2016-01-27 ENCOUNTER — Ambulatory Visit
Admission: RE | Admit: 2016-01-27 | Discharge: 2016-01-27 | Disposition: A | Discharge status: Home / Self Care | Payer: 59 | Source: Ambulatory Visit | Attending: Obstetrics and Gynecology | Admitting: Obstetrics and Gynecology

## 2016-01-27 DIAGNOSIS — M8580 Other specified disorders of bone density and structure, unspecified site: Secondary | ICD-10-CM

## 2016-03-31 ENCOUNTER — Encounter (HOSPITAL_COMMUNITY): Payer: Self-pay

## 2016-05-06 ENCOUNTER — Ambulatory Visit (INDEPENDENT_AMBULATORY_CARE_PROVIDER_SITE_OTHER): Payer: 59 | Admitting: Physician Assistant

## 2016-05-06 VITALS — BP 135/81 | HR 68 | Temp 98.0°F | Resp 17 | Ht 67.5 in | Wt 131.0 lb

## 2016-05-06 DIAGNOSIS — J069 Acute upper respiratory infection, unspecified: Secondary | ICD-10-CM

## 2016-05-06 DIAGNOSIS — R05 Cough: Secondary | ICD-10-CM | POA: Diagnosis not present

## 2016-05-06 DIAGNOSIS — R0981 Nasal congestion: Secondary | ICD-10-CM

## 2016-05-06 DIAGNOSIS — R058 Other specified cough: Secondary | ICD-10-CM

## 2016-05-06 DIAGNOSIS — J029 Acute pharyngitis, unspecified: Secondary | ICD-10-CM

## 2016-05-06 DIAGNOSIS — J3489 Other specified disorders of nose and nasal sinuses: Secondary | ICD-10-CM

## 2016-05-06 MED ORDER — BENZONATATE 100 MG PO CAPS: 100.0000 mg | ORAL_CAPSULE | Freq: Three times a day (TID) | ORAL | 0 refills | Status: DC | PRN

## 2016-05-06 MED ORDER — FLUTICASONE PROPIONATE 50 MCG/ACT NA SUSP: 2.0000 | Freq: Every day | NASAL | 0 refills | Status: AC

## 2016-05-06 MED ORDER — PSEUDOEPHEDRINE HCL ER 120 MG PO TB12: 120.0000 mg | ORAL_TABLET | Freq: Two times a day (BID) | ORAL | 0 refills | Status: DC

## 2016-05-06 MED ORDER — CETIRIZINE HCL 10 MG PO TABS: 10.0000 mg | ORAL_TABLET | Freq: Every day | ORAL | 11 refills | Status: DC

## 2016-05-06 NOTE — Progress Notes (Signed)
MRN: [id number] DOB: December 22, 1955  Subjective:   Jacqueline Macias is a 61 y.o. female presenting for chief complaint of Sore Throat and Ear Pain .  Reports 5 day history of ear fullness, sinus pressure, sore throat and dry cough (no hemoptysis).  Has tried aleve with moderate relief. Denies fever, sinus pain, itchy watery eyes, wheezing, shortness of breath, chest tightness and myalgia, night sweats, chills, nausea, vomiting, abdominal pain and diarrhea. Has had sick contact with multiple people. No history of seasonal allergies, no history of asthma. Patient has not had flu shot this season. No smoking. Denies any other aggravating or relieving factors, no other questions or concerns.  Jacqueline Macias currently has no medications in their medication list. Also has No Known Allergies.  Jacqueline Macias  has no past medical history on file. Also  has no past surgical history on file.   Objective:   Vitals: BP 135/81   Pulse 68   Temp 98 F (36.7 C) (Oral)   Resp 17   Ht 5' 7.5" (1.715 m)   Wt 131 lb (59.4 kg)   SpO2 97%   BMI 20.21 kg/m   Physical Exam  Constitutional: She is oriented to person, place, and time. She appears well-developed and well-nourished. No distress.  HENT:  Head: Normocephalic and atraumatic.  Right Ear: Tympanic membrane is retracted.  Left Ear: Tympanic membrane is retracted.  Nose: Mucosal edema (bilaterally) present. Right sinus exhibits maxillary sinus tenderness (mild ). Right sinus exhibits no frontal sinus tenderness. Left sinus exhibits maxillary sinus tenderness (mild). Left sinus exhibits no frontal sinus tenderness.  Mouth/Throat: Uvula is midline and mucous membranes are normal. Posterior oropharyngeal erythema present. Tonsils are 1+ on the right. Tonsils are 1+ on the left. No tonsillar exudate.  Eyes: Conjunctivae are normal.  Neck: Normal range of motion.  Cardiovascular: Normal rate, regular rhythm, normal heart sounds and intact distal pulses.     Pulmonary/Chest: Effort normal. She has no wheezes. She has no rales.  Lymphadenopathy:       Head (right side): No submental, no submandibular, no tonsillar, no preauricular, no posterior auricular and no occipital adenopathy present.       Head (left side): No submental, no submandibular, no tonsillar, no preauricular, no posterior auricular and no occipital adenopathy present.    She has no cervical adenopathy.       Right: No supraclavicular adenopathy present.       Left: No supraclavicular adenopathy present.  Neurological: She is alert and oriented to person, place, and time.  Skin: Skin is warm and dry.  Psychiatric: She has a normal mood and affect.  Vitals reviewed.   No results found for this or any previous visit (from the past 24 hour(s)).  Assessment and Plan :   1. Nasal congestion - pseudoephedrine (SUDAFED 12 HOUR) 120 MG 12 hr tablet; Take 1 tablet (120 mg total) by mouth 2 (two) times daily.  Dispense: 30 tablet; Refill: 0 - fluticasone (FLONASE) 50 MCG/ACT nasal spray; Place 2 sprays into both nostrils daily.  Dispense: 16 g; Refill: 0 2. Sinus pressure - pseudoephedrine (SUDAFED 12 HOUR) 120 MG 12 hr tablet; Take 1 tablet (120 mg total) by mouth 2 (two) times daily.  Dispense: 30 tablet; Refill: 0 - cetirizine (ZYRTEC) 10 MG tablet; Take 1 tablet (10 mg total) by mouth daily.  Dispense: 30 tablet; Refill: 11 3. Sore throat Given samples of cepacol 4. Dry cough - benzonatate (TESSALON) 100 MG capsule; Take 1-2 capsules (100-200  mg total) by mouth 3 (three) times daily as needed for cough.  Dispense: 40 capsule; Refill: 0  5. Acute upper respiratory infection History and PE findings consistent with upper respiratory infection. Pt is a very healthy individual, explained to her that this could be due to the extreme changes in weather we keep experiencing lately. Will provide symptomatic treatment at this time. Given education material and instructions. Instructed to  return to clinic if symptoms worsen, do not improve in 5-7 days, or as needed  Tenna Delaine, PA-C  [facility] 05/06/2016 8:47 AM

## 2016-05-06 NOTE — Patient Instructions (Addendum)
-It appears that you are either having sinus issues from changes in the weather or you are having a mild viral upper respiratory infection. Either way, I am going to give you symptomatic treatment, which should give you relief for either potential cause. For the nasal congestion, I would like you to take sudafed twice a day and flonase and zyrtec daily. For the sore throat, you can use the cepacol lozenges and drink warm tea with honey and ginger root. For the cough, I have given a prescription for tessalon perles which help to suppress it during the day. You may want to use a humidifier in your room over the next few nights since your symptoms are worse after you wake up. If your symptoms do not improve over the next 5-7 days, please contact our office and let me know. It was such a pleasure meeting you. Tell your son good luck with everything!!!   Upper Respiratory Infection, Adult Most upper respiratory infections (URIs) are caused by a virus. A URI affects the nose, throat, and upper air passages. The most common type of URI is often called "the common cold." Follow these instructions at home:  Take medicines only as told by your doctor.  Gargle warm saltwater or take cough drops to comfort your throat as told by your doctor.  Use a warm mist humidifier or inhale steam from a shower to increase air moisture. This may make it easier to breathe.  Drink enough fluid to keep your pee (urine) clear or pale yellow.  Eat soups and other clear broths.  Have a healthy diet.  Rest as needed.  Go back to work when your fever is gone or your doctor says it is okay.  You may need to stay home longer to avoid giving your URI to others.  You can also wear a face mask and wash your hands often to prevent spread of the virus.  Use your inhaler more if you have asthma.  Do not use any tobacco products, including cigarettes, chewing tobacco, or electronic cigarettes. If you need help quitting, ask your  doctor. Contact a doctor if:  You are getting worse, not better.  Your symptoms are not helped by medicine.  You have chills.  You are getting more short of breath.  You have brown or red mucus.  You have yellow or brown discharge from your nose.  You have pain in your face, especially when you bend forward.  You have a fever.  You have puffy (swollen) neck glands.  You have pain while swallowing.  You have white areas in the back of your throat. Get help right away if:  You have very bad or constant:  Headache.  Ear pain.  Pain in your forehead, behind your eyes, and over your cheekbones (sinus pain).  Chest pain.  You have long-lasting (chronic) lung disease and any of the following:  Wheezing.  Long-lasting cough.  Coughing up blood.  A change in your usual mucus.  You have a stiff neck.  You have changes in your:  Vision.  Hearing.  Thinking.  Mood. This information is not intended to replace advice given to you by your health care provider. Make sure you discuss any questions you have with your health care provider. Document Released: 07/20/2007 Document Revised: 10/04/2015 Document Reviewed: 05/08/2013 Elsevier Interactive Patient Education  2017 [employer].    IF you received an x-ray today, you will receive an invoice from [facility]. Please contact [facility] at  [phone] with questions or concerns regarding your invoice.   IF you received labwork today, you will receive an invoice from [facility]. Please contact [facility] at [phone] with questions or concerns regarding your invoice.   Our billing staff will not be able to assist you with questions regarding bills from these companies.  You will be contacted with the lab results as soon as they are available. The fastest way to get your results is to activate your My Chart account. Instructions are located on the last page of this paperwork. If you have not  heard from [place] regarding the results in 2 weeks, please contact this office.

## 2016-05-07 ENCOUNTER — Telehealth: Payer: Self-pay | Admitting: Physician Assistant

## 2016-05-07 NOTE — Telephone Encounter (Signed)
Denies fever or sob Cough is worse today with lots of phlegm. I advised as long as no new fever or sob, should treat symptoms as described in ov yesterday, she wants me to ask provider opinion and get back to her

## 2016-05-07 NOTE — Telephone Encounter (Signed)
As discussed yesterday, this is likely a viral etiology and antibiotics will not help with this. She is probably just coughing up more because the medications are bringing it out. I think she should continue with supportive treatment. If she develops a fever, chills, shortness of breath please come back or if her symptoms do not resolve by next Tuesday please let me know. Thanks!

## 2016-05-07 NOTE — Telephone Encounter (Signed)
Pt states that she is not feeling any better, she is coughing up "green stuff" and would like to know if you could call her in as "z-pack"?  Pt contact number [phone]

## 2016-05-09 NOTE — Telephone Encounter (Signed)
l/m with Cristina Gong

## 2016-12-15 ENCOUNTER — Other Ambulatory Visit: Payer: Self-pay | Admitting: Obstetrics and Gynecology

## 2016-12-15 DIAGNOSIS — Z803 Family history of malignant neoplasm of breast: Secondary | ICD-10-CM

## 2016-12-30 ENCOUNTER — Ambulatory Visit
Admission: RE | Admit: 2016-12-30 | Discharge: 2016-12-30 | Disposition: A | Discharge status: Home / Self Care | Payer: 59 | Source: Ambulatory Visit | Attending: Obstetrics and Gynecology | Admitting: Obstetrics and Gynecology

## 2016-12-30 DIAGNOSIS — Z803 Family history of malignant neoplasm of breast: Secondary | ICD-10-CM

## 2016-12-30 MED ORDER — GADOBENATE DIMEGLUMINE 529 MG/ML IV SOLN
12.0000 mL | Freq: Once | INTRAVENOUS | Status: AC | PRN
  Administered 2016-12-30: 12 mL via INTRAVENOUS

## 2017-12-19 ENCOUNTER — Other Ambulatory Visit: Payer: Self-pay | Admitting: Obstetrics and Gynecology

## 2017-12-19 DIAGNOSIS — M8580 Other specified disorders of bone density and structure, unspecified site: Secondary | ICD-10-CM

## 2018-02-23 ENCOUNTER — Ambulatory Visit
Admission: RE | Admit: 2018-02-23 | Discharge: 2018-02-23 | Disposition: A | Discharge status: Home / Self Care | Payer: 59 | Source: Ambulatory Visit | Attending: Obstetrics and Gynecology | Admitting: Obstetrics and Gynecology

## 2018-02-23 DIAGNOSIS — M8580 Other specified disorders of bone density and structure, unspecified site: Secondary | ICD-10-CM

## 2019-12-02 ENCOUNTER — Other Ambulatory Visit: Payer: Self-pay | Admitting: Obstetrics and Gynecology

## 2019-12-02 DIAGNOSIS — M8000XA Age-related osteoporosis with current pathological fracture, unspecified site, initial encounter for fracture: Secondary | ICD-10-CM

## 2019-12-31 ENCOUNTER — Encounter: Payer: Self-pay | Admitting: Gastroenterology

## 2020-01-02 ENCOUNTER — Ambulatory Visit (INDEPENDENT_AMBULATORY_CARE_PROVIDER_SITE_OTHER): Payer: 59 | Admitting: Sports Medicine

## 2020-01-02 ENCOUNTER — Other Ambulatory Visit: Payer: Self-pay

## 2020-01-02 VITALS — BP 118/82 | Ht 66.0 in | Wt 128.0 lb

## 2020-01-02 DIAGNOSIS — M25561 Pain in right knee: Secondary | ICD-10-CM

## 2020-01-02 DIAGNOSIS — M79661 Pain in right lower leg: Secondary | ICD-10-CM | POA: Diagnosis not present

## 2020-01-02 MED ORDER — NITROGLYCERIN 0.2 MG/HR TD PT24: MEDICATED_PATCH | TRANSDERMAL | 1 refills | Status: DC

## 2020-01-02 NOTE — Patient Instructions (Addendum)
Nitroglycerin Protocol   Apply 1/4 nitroglycerin patch to affected area daily.  Change position of patch within the affected area every 24 hours.  You may experience a headache during the first 1-2 weeks of using the patch, these should subside.  If you experience headaches after beginning nitroglycerin patch treatment, you may take your preferred over the counter pain reliever.  Another side effect of the nitroglycerin patch is skin irritation or rash related to patch adhesive.  Please notify our office if you develop more severe headaches or rash, and stop the patch.  Tendon healing with nitroglycerin patch may require 12 to 24 weeks depending on the extent of injury.  Men should not use if taking Viagra, Cialis, or Levitra.   Do not use if you have migraines or rosacea.   Follow up in 1 month

## 2020-01-02 NOTE — Progress Notes (Signed)
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  She denies any trauma prior to the onset of the symptoms, but does states she has been training for the [facility] half marathon, which is to take place this weekend.  She denies any specific speed or hill training workouts.  She states that she noticed a posterior pain in her knee with the onset of her running, however she is often able to push through this.  She says the pain feels like a "muscle ache."  Occasionally, she says her knee will "catch" on her when standing up from a deep squat.  She says this Feels like a "muscle twinge," and not a true locking of the knee.  She says that she has never had knee problems before, and that this has caused her some nervousness.  She is hoping to train for an Ironman when she completes this half marathon, does not want this to slow her down.  She says that she is still able to complete all of her running exercises despite the discomfort, but wants to make sure that it is nothing serious.  She denies any significant knee swelling.              ROS:   All other systems were reviewed and are negative.  Objective: Vital Signs: BP 118/82   Ht 5\' 6"  (1.676 m)   Wt 128 lb (58.1 kg)   BMI 20.66 kg/m   Physical Exam:  General:  Alert and oriented, in no acute distress. Pulm:  Breathing unlabored. Psy:  Normal mood, congruent affect. Skin: Right knee with no bruising, rashes, or erythema.  Overlying skin intact. Right knee exam:  General: Normal gait, however she does have decreased elevation of her right leg while running. Standing  exam: No varus or valgus deformity of the knee. No pes planus or pes cavus.  Trendelenburg: No evidence of hip stabilizer weakness  Seated Exam:  Minimal patellar crepitus, Negative J-Sign.   Palpation: Tenderness over posterior lateral aspect of distal hamstring insertion/lateral gastroc insertion.  Tenderness with in popliteal fossa. No tenderness to palpation over medial or lateral joint lines. No tenderness with palpation of patella or patellar tendon. No tenderness over patellar facets.   Supine exam: Trace effusion, normal patellar mobility.   Ligamentous Exam:  No pain or laxity with anterior/posterior drawer.  No obvious Sag.  No pain or laxity with varus/valgus stress across the knee.   Meniscus:  McMurray with no pain or deep clicking.  Thessaly negative.   Special test: No posterior knee pain with lunging on medially rotated foot. No pain with toe walking. No pain with resisted flexion of the knee at 90 degrees or at 30 degrees.   Strength: Hip flexion (L1), Hip Aduction (L2), Knee Extension (L3) are 5/5 Bilaterally Foot Inversion (L4), Dorsiflexion (L5), and Eversion (S1) 5/5 Bilaterally  Sensation: Intact to light touch medial and lateral aspects of lower extremities, and lateral, dorsal, and medial aspects of foot.   Imaging: Limited extremity ultrasound-right knee:  Suprapatellar pouch with a small effusion appreciated. Quadriceps tendon visualized in long and short axis, with fibers intact. Patella tendon visualized intact, with no surrounding effusion. Medial and lateral joint lines with appropriate joint spacing, menisci appear intact with no surrounding effusion. Trochlear groove with decreased spacing beneath the patella. Posterior knee with effusion beneath lateral insertion of gastroc tendon at the junction of distal hamstring.  There is also fluid signal around popliteus, however less so.  Assessment & Plan: 64 year old female presenting to clinic with  concerns of 4 to 6 weeks of right posterior lateral knee pain.  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  Suspect this is due to increasing her running training as she prepares for her upcoming marathon. -Patient was provided with heel lift for her shoes, to help offload the gastroc during her upcoming run. -Educated on heel lift exercises she should do to help rehabilitate her gastroc. -We will start nitroglycerin protocol. -Return to clinic in 3 to 4 weeks if pain has not significantly improved by this time. -She expresses understanding, and has no further questions or concerns today.     I observed and examined the patient with Dr. Elouise Munroe and agree with assessment and plan.  Note reviewed and modified by me. Kb Jay Schlichter

## 2020-01-30 ENCOUNTER — Ambulatory Visit: Payer: 59 | Admitting: Sports Medicine

## 2020-02-04 ENCOUNTER — Other Ambulatory Visit: Payer: Self-pay

## 2020-02-04 ENCOUNTER — Ambulatory Visit (INDEPENDENT_AMBULATORY_CARE_PROVIDER_SITE_OTHER): Payer: 59 | Admitting: Sports Medicine

## 2020-02-04 VITALS — BP 136/82 | Ht 66.5 in | Wt 128.0 lb

## 2020-02-04 DIAGNOSIS — M25561 Pain in right knee: Secondary | ICD-10-CM

## 2020-02-04 NOTE — Assessment & Plan Note (Signed)
Improved. No longer having symptoms and is back to training at her desired pace - Cont to use nitro patches for two more weeks only if she finds them helpful. Otherwise, discontinue - Cont exercises for calf strengthening - F/u as needed

## 2020-02-04 NOTE — Progress Notes (Signed)
    SUBJECTIVE:   CHIEF COMPLAINT / HPI:   Right Lateral Gastrocnemius Strain Patient returns for follow up for left lateral gastrocnemius strain and states she is much improved. She has been using the patches and has changed running shoes. She has no more tenderness or pain. She does not wear the heel pads in her shoes and can run 8-10 miles without pain or tenderness. She has been doing the calf strengthening exercises as well and is tolerating them.  PERTINENT  PMH / PSH: None  OBJECTIVE:   BP 136/82   Ht 5' 6.5" (1.689 m)   Wt 128 lb (58.1 kg)   BMI 20.35 kg/m   [facility] 01/02/2020  Frequency of aerobic exercise (# of days/week) 6  Average time in minutes 60  Frequency of strengthening activities (# of days/week) 3   Knee, Right: Inspection was negative for erythema, ecchymosis, and effusion. No obvious bony abnormalities or signs of osteophyte development. Palpation yielded no asymmetric warmth; No joint line tenderness; Patellar and quadriceps tendons unremarkable, and no tenderness of the pes anserine bursa. No obvious Baker's cyst development. ROM normal in flexion (135 degrees) and extension (0 degrees). Normal hamstring and quadriceps strength. Neurovascularly intact bilaterally. Special Tests  - Cruciate Ligaments:   - Anterior Drawer:  NEG - Posterior Drawer: NEG  - Collateral Ligaments:   - Varus/Valgus Stress test: NEG  - Meniscus:   - Thessaly: NEG    - Patella:   - Patellar grind/compression: NEG    ASSESSMENT/PLAN:   Posterior knee pain, right Improved. No longer having symptoms and is back to training at her desired pace - Cont to use nitro patches for two more weeks only if she finds them helpful. Otherwise, discontinue - Cont exercises for calf strengthening - F/u as needed     Nuala Alpha, DO PGY-4, Sports Medicine Fellow [place]  I observed and examined the patient with the [facility] resident and  agree with assessment and plan.  Note reviewed and modified by me. Ila Mcgill, MD

## 2020-02-04 NOTE — Patient Instructions (Addendum)
It was great to see you today! Thank you for letting me participate in your care!  Today, we discussed your left knee pain which is from a strain of the lateral gastrocnemius tendon. I am glad your symptoms are better. Please continue with the exercises and you can use the nitro patches for 2 more weeks if you feel like it is still helping. Otherwise, you can stop them now if you feel like it is not making a difference.  Happy Holidays and have a safe a Merry Christmas!!!  Be well, Jacqueline Rutherford, DO PGY-4, Sports Medicine Fellow [place]

## 2020-02-12 ENCOUNTER — Other Ambulatory Visit: Payer: Self-pay

## 2020-02-12 ENCOUNTER — Ambulatory Visit (AMBULATORY_SURGERY_CENTER): Payer: Self-pay | Admitting: *Deleted

## 2020-02-12 VITALS — Ht 66.0 in | Wt 130.0 lb

## 2020-02-12 DIAGNOSIS — Z1211 Encounter for screening for malignant neoplasm of colon: Secondary | ICD-10-CM

## 2020-02-12 MED ORDER — SUTAB 1479-225-188 MG PO TABS: 1.0000 | ORAL_TABLET | ORAL | 0 refills | Status: DC

## 2020-02-12 NOTE — Progress Notes (Signed)
Patient is here in-person for PV. Patient denies any allergies to eggs or soy. Patient denies any problems with anesthesia/sedation. Patient denies any oxygen use at home. Patient denies taking any diet/weight loss medications or blood thinners. Patient is not being treated for MRSA or C-diff. Patient is aware of our care-partner policy and Covid-19 safety protocol. EMMI education assigned to the patient for the procedure, sent to MyChart.   COVID-19 vaccines completed on 12/2019 booster, per patient.   Prep Prescription coupon given to the patient. 

## 2020-02-17 ENCOUNTER — Encounter: Payer: 59 | Admitting: Gastroenterology

## 2020-02-21 ENCOUNTER — Encounter: Payer: Self-pay | Admitting: Gastroenterology

## 2020-02-28 ENCOUNTER — Ambulatory Visit (AMBULATORY_SURGERY_CENTER): Payer: 59 | Admitting: Gastroenterology

## 2020-02-28 ENCOUNTER — Encounter: Payer: Self-pay | Admitting: Gastroenterology

## 2020-02-28 ENCOUNTER — Other Ambulatory Visit: Payer: Self-pay

## 2020-02-28 VITALS — BP 123/81 | HR 52 | Temp 98.0°F | Resp 14

## 2020-02-28 DIAGNOSIS — Z1211 Encounter for screening for malignant neoplasm of colon: Secondary | ICD-10-CM

## 2020-02-28 DIAGNOSIS — D120 Benign neoplasm of cecum: Secondary | ICD-10-CM

## 2020-02-28 DIAGNOSIS — D122 Benign neoplasm of ascending colon: Secondary | ICD-10-CM

## 2020-02-28 DIAGNOSIS — K635 Polyp of colon: Secondary | ICD-10-CM

## 2020-02-28 HISTORY — PX: COLONOSCOPY: SHX174

## 2020-02-28 MED ORDER — SODIUM CHLORIDE 0.9 % IV SOLN: 500.0000 mL | Freq: Once | INTRAVENOUS | Status: DC

## 2020-02-28 NOTE — Patient Instructions (Signed)
Handouts Provided:  Polyps  YOU HAD AN ENDOSCOPIC PROCEDURE TODAY AT THE  ENDOSCOPY CENTER:   Refer to the procedure report that was given to you for any specific questions about what was found during the examination.  If the procedure report does not answer your questions, please call your gastroenterologist to clarify.  If you requested that your care partner not be given the details of your procedure findings, then the procedure report has been included in a sealed envelope for you to review at your convenience later.  YOU SHOULD EXPECT: Some feelings of bloating in the abdomen. Passage of more gas than usual.  Walking can help get rid of the air that was put into your GI tract during the procedure and reduce the bloating. If you had a lower endoscopy (such as a colonoscopy or flexible sigmoidoscopy) you may notice spotting of blood in your stool or on the toilet paper. If you underwent a bowel prep for your procedure, you may not have a normal bowel movement for a few days.  Please Note:  You might notice some irritation and congestion in your nose or some drainage.  This is from the oxygen used during your procedure.  There is no need for concern and it should clear up in a day or so.  SYMPTOMS TO REPORT IMMEDIATELY:   Following lower endoscopy (colonoscopy or flexible sigmoidoscopy):  Excessive amounts of blood in the stool  Significant tenderness or worsening of abdominal pains  Swelling of the abdomen that is new, acute  Fever of 100F or higher  For urgent or emergent issues, a gastroenterologist can be reached at any hour by calling [phone]. Do not use MyChart messaging for urgent concerns.    DIET:  We do recommend a small meal at first, but then you may proceed to your regular diet.  Drink plenty of fluids but you should avoid alcoholic beverages for 24 hours.  ACTIVITY:  You should plan to take it easy for the rest of today and you should NOT DRIVE or use heavy  machinery until tomorrow (because of the sedation medicines used during the test).    FOLLOW UP: Our staff will call the number listed on your records 48-72 hours following your procedure to check on you and address any questions or concerns that you may have regarding the information given to you following your procedure. If we do not reach you, we will leave a message.  We will attempt to reach you two times.  During this call, we will ask if you have developed any symptoms of COVID 19. If you develop any symptoms (ie: fever, flu-like symptoms, shortness of breath, cough etc.) before then, please call [phone].  If you test positive for Covid 19 in the 2 weeks post procedure, please call and report this information to us.    If any biopsies were taken you will be contacted by phone or by letter within the next 1-3 weeks.  Please call us at [phone] if you have not heard about the biopsies in 3 weeks.    SIGNATURES/CONFIDENTIALITY: You and/or your care partner have signed paperwork which will be entered into your electronic medical record.  These signatures attest to the fact that that the information above on your After Visit Summary has been reviewed and is understood.  Full responsibility of the confidentiality of this discharge information lies with you and/or your care-partner.  

## 2020-02-28 NOTE — Progress Notes (Signed)
PT taken to PACU. Monitors in place. VSS. Report given to RN. 

## 2020-02-28 NOTE — Op Note (Signed)
[place] Patient Name: Jacqueline Macias Procedure Date: 02/28/2020 9:11 AM MRN: [id number] Endoscopist: Remo Lipps P. Havery Moros , MD Age: 65 Referring MD:  Date of Birth: Dec 29, 1955 Gender: Female Account #: [id number] Procedure:                Colonoscopy Indications:              Screening for colorectal malignant neoplasm Medicines:                Monitored Anesthesia Care Procedure:                Pre-Anesthesia Assessment:                           - Prior to the procedure, a History and Physical                            was performed, and patient medications and                            allergies were reviewed. The patient's tolerance of                            previous anesthesia was also reviewed. The risks                            and benefits of the procedure and the sedation                            options and risks were discussed with the patient.                            All questions were answered, and informed consent                            was obtained. Prior Anticoagulants: The patient has                            taken no previous anticoagulant or antiplatelet                            agents. ASA Grade Assessment: II - A patient with                            mild systemic disease. After reviewing the risks                            and benefits, the patient was deemed in                            satisfactory condition to undergo the procedure.                           After obtaining informed consent, the colonoscope  was passed under direct vision. Throughout the                            procedure, the patient's blood pressure, pulse, and                            oxygen saturations were monitored continuously. The                            Olympus [id number]) Colonoscope was                            introduced through the anus and advanced to the the                            cecum,  identified by appendiceal orifice and                            ileocecal valve. The colonoscopy was performed                            without difficulty. The patient tolerated the                            procedure well. The quality of the bowel                            preparation was fair. The ileocecal valve,                            appendiceal orifice, and rectum were photographed. Scope In: 9:12:28 AM Scope Out: 9:52:51 AM Scope Withdrawal Time: 0 hours 30 minutes 14 seconds  Total Procedure Duration: 0 hours 40 minutes 23 seconds  Findings:                 The perianal and digital rectal examinations were                            normal.                           A diminutive polyp was found in the appendiceal                            orifice. The polyp was sessile. The polyp was                            removed with a cold biopsy forceps. Resection and                            retrieval were complete.                           A roughly 30-35 mm polyp was found in the ascending  colon. The polyp was flat. The polyp was removed                            with a piecemeal technique using a cold snare.                            Initially I did not appreciate how large polyp this                            was when resection began, as it extended over the                            more proximal fold, and as it went on it became                            more apparent where was margin was. Resection and                            retrieval were thought to be complete. Area across                            the lumen from the lesion was tattooed with an                            injection of Spot (carbon black).                           A moderate amount of semi-liquid stool was found in                            the entire colon, making visualization difficult.                            Lavage of the colon was performed using copious                             amounts of sterile water, resulting in incomplete                            clearance with fair visualization. Portions of the                            left colon were not well visualized.                           The exam was otherwise without abnormality. Complications:            No immediate complications. Estimated blood loss:                            Minimal. Estimated Blood Loss:     Estimated blood loss was minimal. Impression:               -  Preparation of the colon was fair.                           - One diminutive polyp at the appendiceal orifice,                            removed with a cold biopsy forceps. Resected and                            retrieved.                           - One 30-35 mm polyp in the ascending colon,                            removed piecemeal using a cold snare. Resected and                            retrieved. Tattooed.                           - The examination was otherwise normal. Recommendation:           - Patient has a contact number available for                            emergencies. The signs and symptoms of potential                            delayed complications were discussed with the                            patient. Return to normal activities tomorrow.                            Written discharge instructions were provided to the                            patient.                           - Resume previous diet.                           - Continue present medications.                           - Await pathology results.                           - Repeat colonoscopy in 6 months for surveillance                            after piecemeal polypectomy and in light of bowel  prep on this exam, using double prep. Remo Lipps P. Wanell Lorenzi, MD 02/28/2020 10:04:15 AM This report has been signed electronically.

## 2020-02-28 NOTE — Progress Notes (Signed)
Pt's states no medical or surgical changes since previsit or office visit.  ° °Vitals CW °

## 2020-03-03 ENCOUNTER — Telehealth: Payer: Self-pay

## 2020-03-03 NOTE — Telephone Encounter (Signed)
  Follow up Call-  Call back number 02/28/2020  Post procedure Call Back phone  # [phone]  Permission to leave phone message Yes  Some recent data might be hidden     Patient questions:  Do you have a fever, pain , or abdominal swelling? No. Pain Score  0 *  Have you tolerated food without any problems? Yes.    Have you been able to return to your normal activities? Yes.    Do you have any questions about your discharge instructions: Diet   No. Medications  No. Follow up visit  No.  Do you have questions or concerns about your Care? No.  Actions: * If pain score is 4 or above: No action needed, pain <4. 1. Have you developed a fever since your procedure? no  2.   Have you had an respiratory symptoms (SOB or cough) since your procedure? no  3.   Have you tested positive for COVID 19 since your procedure no  4.   Have you had any family members/close contacts diagnosed with the COVID 19 since your procedure?  no   If yes to any of these questions please route to Joylene John, RN and Joella Prince, RN

## 2020-03-04 ENCOUNTER — Other Ambulatory Visit: Payer: Self-pay | Admitting: Sports Medicine

## 2020-08-21 ENCOUNTER — Encounter: Payer: Self-pay | Admitting: Gastroenterology

## 2020-08-26 DIAGNOSIS — M1990 Unspecified osteoarthritis, unspecified site: Secondary | ICD-10-CM | POA: Insufficient documentation

## 2020-08-26 DIAGNOSIS — M810 Age-related osteoporosis without current pathological fracture: Secondary | ICD-10-CM | POA: Insufficient documentation

## 2020-08-26 DIAGNOSIS — F5101 Primary insomnia: Secondary | ICD-10-CM | POA: Insufficient documentation

## 2020-09-22 ENCOUNTER — Ambulatory Visit: Payer: 59 | Admitting: Sports Medicine

## 2020-10-07 ENCOUNTER — Other Ambulatory Visit: Payer: Self-pay

## 2020-10-07 ENCOUNTER — Ambulatory Visit (AMBULATORY_SURGERY_CENTER): Payer: 59 | Admitting: *Deleted

## 2020-10-07 VITALS — Ht 65.5 in | Wt 120.0 lb

## 2020-10-07 DIAGNOSIS — Z86010 Personal history of colonic polyps: Secondary | ICD-10-CM

## 2020-10-07 MED ORDER — NA SULFATE-K SULFATE-MG SULF 17.5-3.13-1.6 GM/177ML PO SOLN: 1.0000 | Freq: Once | ORAL | 0 refills | Status: AC

## 2020-10-07 NOTE — Progress Notes (Signed)
Pt's previsit is done over the phone and all paperwork (prep instructions, blank consent form to just read over) sent to patient.  Pt's name and DOB verified at the beginning of the previsit.  Pt denies any difficulty with ambulating.    No trouble with anesthesia, denies being told they were difficult to intubate, or hx/fam hx of malignant hyperthermia per pt  2 day prep given per DO   No egg or soy allergy  No home oxygen use   No medications for weight loss taken  Pt informed that we do not do prior authorizations for prep

## 2020-10-08 ENCOUNTER — Emergency Department (HOSPITAL_COMMUNITY): Payer: 59

## 2020-10-08 ENCOUNTER — Emergency Department (HOSPITAL_COMMUNITY)
Admission: EM | Admit: 2020-10-08 | Discharge: 2020-10-08 | Disposition: A | Discharge status: Home / Self Care | Payer: 59 | Attending: Emergency Medicine | Admitting: Emergency Medicine

## 2020-10-08 DIAGNOSIS — R4701 Aphasia: Secondary | ICD-10-CM | POA: Diagnosis present

## 2020-10-08 DIAGNOSIS — Z87891 Personal history of nicotine dependence: Secondary | ICD-10-CM | POA: Diagnosis not present

## 2020-10-08 DIAGNOSIS — E860 Dehydration: Secondary | ICD-10-CM | POA: Diagnosis not present

## 2020-10-08 DIAGNOSIS — F8081 Childhood onset fluency disorder: Secondary | ICD-10-CM | POA: Insufficient documentation

## 2020-10-08 LAB — COMPREHENSIVE METABOLIC PANEL
ALT: 37 U/L (ref 0–44)
AST: 60 U/L — ABNORMAL HIGH (ref 15–41)
Albumin: 3.8 g/dL (ref 3.5–5.0)
Alkaline Phosphatase: 51 U/L (ref 38–126)
Anion gap: 9 (ref 5–15)
BUN: 15 mg/dL (ref 8–23)
CO2: 29 mmol/L (ref 22–32)
Calcium: 9.6 mg/dL (ref 8.9–10.3)
Chloride: 101 mmol/L (ref 98–111)
Creatinine, Ser: 0.85 mg/dL (ref 0.44–1.00)
GFR, Estimated: >60 mL/min (ref >60)
Glucose, Bld: 144 mg/dL — ABNORMAL HIGH (ref 70–99)
Potassium: 3.9 mmol/L (ref 3.5–5.1)
Sodium: 139 mmol/L (ref 135–145)
Total Bilirubin: 0.5 mg/dL (ref 0.3–1.2)
Total Protein: 6.4 g/dL — ABNORMAL LOW (ref 6.5–8.1)

## 2020-10-08 LAB — CBC
HCT: 39.5 % (ref 36.0–46.0)
Hemoglobin: 12.9 g/dL (ref 12.0–15.0)
MCH: 31.4 pg (ref 26.0–34.0)
MCHC: 32.7 g/dL (ref 30.0–36.0)
MCV: 96.1 fL (ref 80.0–100.0)
Platelets: 193 10*3/uL (ref 150–400)
RBC: 4.11 MIL/uL (ref 3.87–5.11)
RDW: 12.7 % (ref 11.5–15.5)
WBC: 4.3 10*3/uL (ref 4.0–10.5)
nRBC: 0 % (ref 0.0–0.2)

## 2020-10-08 LAB — CBG MONITORING, ED: Glucose-Capillary: 109 mg/dL — ABNORMAL HIGH (ref 70–99)

## 2020-10-08 LAB — PROTIME-INR
INR: 0.9 (ref 0.8–1.2)
Prothrombin Time: 11.6 seconds (ref 11.4–15.2)

## 2020-10-08 NOTE — ED Triage Notes (Signed)
Patient sent to ED from [facility] after sudden onset of slurred speech after a lidocaine injection in her neck. Symptoms resolved prior to leaving [facility], grip strength equal bilaterally, speech is clear, no vision changes, no sensation changes, mouth is mildly asymmetrical on left, patient is unsure if that is normal for her.

## 2020-10-08 NOTE — Discharge Instructions (Addendum)
Your work-up today was reassuring. Your lab work was without any abnormalities.  CT scan of your head was without any intracranial bleeds or abnormalities.  Your heart tracing/EKG was without any abnormalities as well.  I recommend following up with your primary care provider and discussing this episode.  It is not unreasonable to have an ultrasound done of your carotid arteries however given the reassuring story today I have a very low suspicion at that your symptoms were caused by any carotid artery or cerebellar/cerebrum/brain anomalies.  You may always return to the ER for any new or concerning symptoms.

## 2020-10-08 NOTE — ED Provider Notes (Signed)
[facility] EMERGENCY DEPARTMENT Provider Note   CSN: [id number] Arrival date & time: 10/08/20  1217     History Chief Complaint  Patient presents with   Aphasia    Jacqueline Macias is a 65 y.o. female.  HPI Patient is a 65 year old female with past medical history that is without any pertinent positives.  She is a very active 65 year old female has a tendency to run for extended periods at regular intervals despite that does not take any notable medications daily apart from Lexapro and some as needed sleep medication  Patient is presented to the ER today from [facility] after she has had a sudden onset of stuttering speech that occurred after she had an injection in the back of her neck.  Based on her story it seems that she is getting trigger point injections for muscular pain related to her swimming/exercising.  She states that limits after the injection occurred she tried to speak and stuttered her stuttering seem to be ongoing for greater than 10 minutes which caused her to be the clinic to send her to the ER for evaluation of this.  She was seen in triage and a CT scan of the head was ordered along with some laboratory studies.  She states that her symptoms have completely resolved at this time.  She denies at any point having weakness or numbness in any extremity or in her face.  She denies any pain.  Denies any chest pain shortness of breath lightheadedness or dizziness.  She states that during the episode she did have some heart palpitations and was told that her blood pressure got quite high.  Denies any headache, visual symptoms such as field cuts or amaurosis fugax.  Denies any difficulty walking vertigo and she states that she had no point had slurred speech rather than stuttering symptoms.  She has no history of CVA, she is non-smoker, denies any alcohol use or recreational drugs.    No past medical history on file.  Patient  Active Problem List   Diagnosis Date Noted   Posterior knee pain, right 01/02/2020   Genetic testing 12/05/2014   Family history of breast cancer in mother 11/06/2014   Family history of breast cancer in female 11/06/2014    Past Surgical History:  Procedure Laterality Date   BREAST ENHANCEMENT SURGERY     CESAREAN SECTION     COLONOSCOPY  2007   Dr.Ganem Eagle normal exam per pt   COLONOSCOPY  02/28/2020   DILATION AND CURETTAGE OF UTERUS     polyp     OB History   No obstetric history on file.     Family History  Problem Relation Age of Onset   Breast cancer Mother 35   Other Mother        TAH in 14s   Lymphoma Father 32   Diverticulitis Father    Breast cancer Maternal Aunt        dx. 76-78   Heart attack Maternal Grandfather    Dementia Paternal Grandmother    Dementia Paternal Grandfather    Breast cancer Maternal Aunt 80   Breast cancer Other    Colon cancer Neg Hx    Esophageal cancer Neg Hx    Rectal cancer Neg Hx    Stomach cancer Neg Hx    Colon polyps Neg Hx     Social History   Tobacco Use   Smoking status: Former    Packs/day: 0.50  Years: 5.00    Pack years: 2.50    Types: Cigarettes    Quit date: 02/14/1977    Years since quitting: 43.6   Smokeless tobacco: Never  Vaping Use   Vaping Use: Never used  Substance Use Topics   Alcohol use: Yes    Alcohol/week: 1.0 standard drink    Types: 1 Glasses of wine per week   Drug use: Not Currently    Home Medications Prior to Admission medications   Medication Sig Start Date End Date Taking? Authorizing Provider  alendronate (FOSAMAX) 70 MG tablet Take 70 mg by mouth once a week. 11/25/19   [provider]  ALPRAZolam Duanne Moron) 0.5 MG tablet Take 0.25-0.5 mg by mouth 3 (three) times daily as needed. 11/19/19   [provider]  CALCIUM PO Take by mouth.    [provider]  escitalopram (LEXAPRO) 5 MG tablet Take 5 mg by mouth daily. 02/18/20   [provider]   estradiol (VIVELLE-DOT) 0.025 MG/24HR Place 1 patch onto the skin 2 (two) times a week. 01/20/20   [provider]  fluticasone (FLONASE) 50 MCG/ACT nasal spray Place 2 sprays into both nostrils daily. 05/06/16   Tenna Delaine D, PA-C  nitroGLYCERIN (NITRODUR - DOSED IN MG/24 HR) 0.2 mg/hr patch USE 1/4 PATCH TO THE AFFECTED AREA DAILY AS DIRECTED Patient not taking: Reported on 10/07/2020 03/04/20   Stefanie Libel, MD  progesterone (PROMETRIUM) 100 MG capsule Take 100 mg by mouth at bedtime. 11/21/19   [provider]  VITAMIN D PO Take by mouth.    [provider]  zolpidem (AMBIEN) 5 MG tablet Take 5 mg by mouth at bedtime as needed. 11/19/19   [provider]    Allergies    Patient has no known allergies.  Review of Systems   Review of Systems  Constitutional:  Negative for fever.  HENT:  Negative for congestion.   Eyes:  Negative for pain.  Respiratory:  Negative for shortness of breath.   Cardiovascular:  Negative for chest pain and leg swelling.  Gastrointestinal:  Negative for abdominal pain, nausea and vomiting.  Genitourinary:  Negative for dysuria.  Musculoskeletal:  Negative for myalgias.  Skin:  Negative for rash.  Neurological:  Negative for dizziness and headaches.       Repetitive stuttering speech  Psychiatric/Behavioral:  Negative for confusion.    Physical Exam Updated Vital Signs BP (!) 173/91   Pulse 62   Temp 98 F (36.7 C) (Oral)   Resp 15   SpO2 100%   Physical Exam Vitals and nursing note reviewed.  Constitutional:      General: She is not in acute distress.    Comments: Pleasant well-appearing 65 year old.  In no acute distress.  Sitting comfortably in bed.  Able answer questions appropriately follow commands. No increased work of breathing. Speaking in full sentences.   HENT:     Head: Normocephalic and atraumatic.     Nose: Nose normal.     Mouth/Throat:     Mouth: Mucous membranes are moist.  Eyes:      General: No scleral icterus.    Extraocular Movements: Extraocular movements intact.     Pupils: Pupils are equal, round, and reactive to light.  Cardiovascular:     Rate and Rhythm: Normal rate and regular rhythm.     Pulses: Normal pulses.     Heart sounds: Normal heart sounds.     Comments: Heart rate between 60 and 70.  No murmurs  rubs or gallops. Pulmonary:     Effort: Pulmonary effort is normal. No respiratory distress.     Breath sounds: No wheezing.  Abdominal:     Palpations: Abdomen is soft.     Tenderness: There is no abdominal tenderness. There is no guarding or rebound.  Musculoskeletal:     Cervical back: Normal range of motion.     Right lower leg: No edema.     Left lower leg: No edema.  Skin:    General: Skin is warm and dry.     Capillary Refill: Capillary refill takes less than 2 seconds.  Neurological:     Mental Status: She is alert. Mental status is at baseline.     Comments: Alert and oriented to self, place, time and event.   Speech is fluent, clear without dysarthria or dysphasia.   Strength 5/5 in upper/lower extremities   Sensation intact in upper/lower extremities   Normal gait.  Negative Romberg. No pronator drift.  Normal finger-to-nose and feet tapping.  CN I not tested  CN II grossly intact visual fields bilaterally. Did not visualize posterior eye.  CN III, IV, VI PERRLA and EOMs intact bilaterally  CN V Intact sensation to sharp and light touch to the face  CN VII facial movements symmetric  CN VIII not tested  CN IX, X no uvula deviation, symmetric rise of soft palate  CN XI 5/5 SCM and trapezius strength bilaterally  CN XII Midline tongue protrusion, symmetric L/R movements   Psychiatric:        Mood and Affect: Mood normal.        Behavior: Behavior normal.    ED Results / Procedures / Treatments   Labs (all labs ordered are listed, but only abnormal results are displayed) Labs Reviewed  COMPREHENSIVE METABOLIC PANEL - Abnormal;  Notable for the following components:      Result Value   Glucose, Bld 144 (*)    Total Protein 6.4 (*)    AST 60 (*)    All other components within normal limits  CBG MONITORING, ED - Abnormal; Notable for the following components:   Glucose-Capillary 109 (*)    All other components within normal limits  CBC  PROTIME-INR    EKG EKG Interpretation  Date/Time:  Thursday October 08 2020 12:30:14 EDT Ventricular Rate:  64 PR Interval:  146 QRS Duration: 84 QT Interval:  414 QTC Calculation: 427 R Axis:   49 Text Interpretation: Normal sinus rhythm no acute ST/T changes No old tracing to compare Confirmed by Sherwood Gambler [phone]) on 10/09/2020 8:16:06 AM  Radiology CT HEAD WO CONTRAST  Result Date: 10/08/2020 CLINICAL DATA:  Slurred speech. EXAM: CT HEAD WITHOUT CONTRAST TECHNIQUE: Contiguous axial images were obtained from the base of the skull through the vertex without intravenous contrast. COMPARISON:  None. FINDINGS: Brain: No evidence of acute infarction, hemorrhage, hydrocephalus, extra-axial collection or mass lesion/mass effect. Vascular: No hyperdense vessel or unexpected calcification. Skull: Normal. Negative for fracture or focal lesion. Sinuses/Orbits: No acute finding. Other: None. IMPRESSION: No acute intracranial abnormality seen. Electronically Signed   By: Marijo Conception M.D.   On: 10/08/2020 13:57    Procedures Procedures   Medications Ordered in ED Medications - No data to display  ED Course  I have reviewed the triage vital signs and the nursing notes.  Pertinent labs & imaging results that were available during my care of the patient were reviewed by me and considered in my medical decision making (see  chart for details).    MDM Rules/Calculators/A&P                           Patient is an exceedingly healthy 65 year old female very active and has no significant stroke risk factors such as TIAs, HLD, hyperlipidemia, HTN, heart disease, DM2, smoking,  does not use any recreational drugs or drink alcohol.  Physical exam is reassuring she had a brief episode of persistent stuttering after neck injection her neck is well-appearing she has no visual symptoms and I doubt carotid artery injury.  She has very low risk profile for carotid artery stenosis.  Her EKG is without arrhythmia or A. fib.  CBC and CMP are unremarkable.  AST very marginally elevated perhaps due to some dehydration.  Patient has not eaten or drank very much today and did exercise relatively excessively--although given this patient's physical fitness not outside the realm of possibility that she did not overexert herself--however she certainly has predisposed her self to some vasovagal/dehydration symptoms.  Difficult to assess/ascertain whether her instrumentation/injection had anything to do with her symptoms.  May have caused a transient vasovagal bradycardia.  Her baseline heart rate is very low she states 50s/60s and she may be at higher risk for some vasovagal symptoms.  CT scan of head unremarkable.  Patient is neurologically intact from my assessment up in triage on.  EKG nonischemic.  Normal sinus rhythm, no significant axis deviations, normal R wave progression, no ST-T wave abnormalities of note.  I discussed this case with my attending physician who cosigned this note including patient's presenting symptoms, physical exam, and planned diagnostics and interventions. Attending physician stated agreement with plan or made changes to plan which were implemented.   Attending physician assessed patient at bedside.   Patient ambulated prior to discharge.  Husband at bedside able to corroborate patient's story.  Overall very reassuring evaluation in the ER today.  Will discharge home with close follow-up with primary care provider.  Strict return precautions given.  Final Clinical Impression(s) / ED Diagnoses Final diagnoses:  Stutter    Rx / DC Orders ED Discharge Orders      None        Tedd Sias, [place] 10/10/20 1621    Lucrezia Starch, MD 10/13/20 [phone]

## 2020-10-08 NOTE — ED Notes (Signed)
Patient transported to CT 

## 2020-10-08 NOTE — ED Provider Notes (Signed)
Emergency Medicine Provider Triage Evaluation Note  Jacqueline Macias , a 65 y.o. female  was evaluated in triage.  Pt complains of slurred speech.  Review of Systems  Positive: Heart palpitation, slurred speech, feeling flushed Negative: Headache, vision changes, focal numbness or focal weakness  Physical Exam  BP (!) 170/116 (BP Location: Left Arm)   Pulse 72   Resp 18   SpO2 100%  Gen:   Awake, no distress   Resp:  Normal effort  MSK:   Moves extremities without difficulty  Other:    Medical Decision Making  Medically screening exam initiated at 12:33 PM.  Appropriate orders placed.  Jacqueline Macias was informed that the remainder of the evaluation will be completed by another provider, this initial triage assessment does not replace that evaluation, and the importance of remaining in the ED until their evaluation is complete.  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  Was at orthopedic office for lidocaine injection to L hip and neck for bursitis and after lidocaine injection she experienced slurred speech and heart palpitation.  LKN 2 hrs.  Sxs has since resolved.    Jacqueline Moras, PA-C 10/08/20 1234    Sherwood Gambler, MD 10/08/20 [phone]

## 2020-10-09 ENCOUNTER — Other Ambulatory Visit: Payer: Self-pay | Admitting: Family Medicine

## 2020-10-09 DIAGNOSIS — G459 Transient cerebral ischemic attack, unspecified: Secondary | ICD-10-CM

## 2020-10-10 ENCOUNTER — Other Ambulatory Visit: Payer: Self-pay

## 2020-10-10 ENCOUNTER — Ambulatory Visit
Admission: RE | Admit: 2020-10-10 | Discharge: 2020-10-10 | Disposition: A | Discharge status: Home / Self Care | Payer: 59 | Source: Ambulatory Visit | Attending: Family Medicine | Admitting: Family Medicine

## 2020-10-10 DIAGNOSIS — G459 Transient cerebral ischemic attack, unspecified: Secondary | ICD-10-CM

## 2020-10-10 MED ORDER — GADOBENATE DIMEGLUMINE 529 MG/ML IV SOLN
11.0000 mL | Freq: Once | INTRAVENOUS | Status: AC | PRN
  Administered 2020-10-10: 11 mL via INTRAVENOUS

## 2020-10-12 ENCOUNTER — Other Ambulatory Visit: Payer: 59

## 2020-10-15 HISTORY — PX: COLONOSCOPY: SHX174

## 2020-10-22 ENCOUNTER — Ambulatory Visit (AMBULATORY_SURGERY_CENTER): Payer: 59 | Admitting: Gastroenterology

## 2020-10-22 ENCOUNTER — Encounter: Payer: Self-pay | Admitting: Gastroenterology

## 2020-10-22 ENCOUNTER — Other Ambulatory Visit: Payer: Self-pay

## 2020-10-22 VITALS — BP 134/76 | HR 60 | Temp 97.5°F | Resp 14 | Ht 66.5 in | Wt 120.0 lb

## 2020-10-22 DIAGNOSIS — Z86010 Personal history of colonic polyps: Secondary | ICD-10-CM | POA: Diagnosis present

## 2020-10-22 MED ORDER — SODIUM CHLORIDE 0.9 % IV SOLN
500.0000 mL | Freq: Once | INTRAVENOUS | Status: DC
Start: 2020-10-22 — End: 2020-10-22

## 2020-10-22 NOTE — Patient Instructions (Signed)
Please read handouts provided. Continue present medications. Repeat colonoscopy in 2 years with Dr. Havery Moros for screening.   YOU HAD AN ENDOSCOPIC PROCEDURE TODAY AT [facility]:   Refer to the procedure report that was given to you for any specific questions about what was found during the examination.  If the procedure report does not answer your questions, please call your gastroenterologist to clarify.  If you requested that your care partner not be given the details of your procedure findings, then the procedure report has been included in a sealed envelope for you to review at your convenience later.  YOU SHOULD EXPECT: Some feelings of bloating in the abdomen. Passage of more gas than usual.  Walking can help get rid of the air that was put into your GI tract during the procedure and reduce the bloating. If you had a lower endoscopy (such as a colonoscopy or flexible sigmoidoscopy) you may notice spotting of blood in your stool or on the toilet paper. If you underwent a bowel prep for your procedure, you may not have a normal bowel movement for a few days.  Please Note:  You might notice some irritation and congestion in your nose or some drainage.  This is from the oxygen used during your procedure.  There is no need for concern and it should clear up in a day or so.  SYMPTOMS TO REPORT IMMEDIATELY:  Following lower endoscopy (colonoscopy or flexible sigmoidoscopy):  Excessive amounts of blood in the stool  Significant tenderness or worsening of abdominal pains  Swelling of the abdomen that is new, acute  Fever of 100F or higher   For urgent or emergent issues, a gastroenterologist can be reached at any hour by calling [phone]. Do not use MyChart messaging for urgent concerns.    DIET:  We do recommend a small meal at first, but then you may proceed to your regular diet.  Drink plenty of fluids but you should avoid alcoholic beverages for 24  hours.  ACTIVITY:  You should plan to take it easy for the rest of today and you should NOT DRIVE or use heavy machinery until tomorrow (because of the sedation medicines used during the test).    FOLLOW UP: Our staff will call the number listed on your records 48-72 hours following your procedure to check on you and address any questions or concerns that you may have regarding the information given to you following your procedure. If we do not reach you, we will leave a message.  We will attempt to reach you two times.  During this call, we will ask if you have developed any symptoms of COVID 19. If you develop any symptoms (ie: fever, flu-like symptoms, shortness of breath, cough etc.) before then, please call [phone].  If you test positive for Covid 19 in the 2 weeks post procedure, please call and report this information to [place].    If any biopsies were taken you will be contacted by phone or by letter within the next 1-3 weeks.  Please call us at [phone] if you have not heard about the biopsies in 3 weeks.    SIGNATURES/CONFIDENTIALITY: You and/or your care partner have signed paperwork which will be entered into your electronic medical record.  These signatures attest to the fact that that the information above on your After Visit Summary has been reviewed and is understood.  Full responsibility of the confidentiality of this discharge information lies with you and/or your care-partner.

## 2020-10-22 NOTE — Progress Notes (Signed)
History and Physical:  This patient presents for endoscopic testing for: Encounter Diagnosis  Name Primary?   Personal history of colonic polyps Yes   Patient had a large right colon SSP removed piecemeal by cold polypectomy 02/28/2020 by Dr. Havery Moros  Patient denies altered bowel habits or rectal bleeding.  She was in the ED 10/08/2020 for an episode of stuttering speech that occurred after a trigger point neck injection.  That extensive ED provider note was reviewed, and was also reviewed by our anesthesia staff today.  It was felt this most likely did not represent an acute neurologic event.  The symptoms resolved either before or during the ED visit.  She had a subsequent normal 2D echocardiogram with agitated saline and MRI brain and MRA neck other than "mild chronic small vessel disease".  Patient is feeling well today, has not had any further stuttering of speech and has no described chronic neurologic symptoms.    Past Medical History: History reviewed. No pertinent past medical history.   Past Surgical History: Past Surgical History:  Procedure Laterality Date   BREAST ENHANCEMENT SURGERY     CESAREAN SECTION     COLONOSCOPY  2007   Dr.Ganem Eagle normal exam per pt   COLONOSCOPY  02/28/2020   DILATION AND CURETTAGE OF UTERUS     polyp    Allergies: No Known Allergies  Outpatient Meds: Current Outpatient Medications  Medication Sig Dispense Refill   alendronate (FOSAMAX) 70 MG tablet Take 70 mg by mouth once a week.     ALPRAZolam (XANAX) 0.5 MG tablet Take 0.25-0.5 mg by mouth 3 (three) times daily as needed.     fluticasone (FLONASE) 50 MCG/ACT nasal spray Place 2 sprays into both nostrils daily. 16 g 0   methylPREDNISolone sodium succinate in dextrose solution Inject into the vein daily. X1 dose neck/left hip     VITAMIN D PO Take by mouth.     CALCIUM PO Take by mouth. (Patient not taking: Reported on 10/22/2020)     escitalopram (LEXAPRO) 5 MG tablet Take 5  mg by mouth daily. (Patient not taking: Reported on 10/22/2020)     estradiol (VIVELLE-DOT) 0.025 MG/24HR Place 1 patch onto the skin 2 (two) times a week. (Patient not taking: Reported on 10/22/2020)     nitroGLYCERIN (NITRODUR - DOSED IN MG/24 HR) 0.2 mg/hr patch USE 1/4 PATCH TO THE AFFECTED AREA DAILY AS DIRECTED (Patient not taking: No sig reported) 30 patch 1   progesterone (PROMETRIUM) 100 MG capsule Take 100 mg by mouth at bedtime. (Patient not taking: Reported on 10/22/2020)     zolpidem (AMBIEN) 5 MG tablet Take 5 mg by mouth at bedtime as needed. (Patient not taking: Reported on 10/22/2020)     Current Facility-Administered Medications  Medication Dose Route Frequency Provider Last Rate Last Admin   0.9 %  sodium chloride infusion  500 mL Intravenous Once Armbruster, Carlota Raspberry, MD          ___________________________________________________________________ Objective   Exam:  BP 129/84   Pulse 72   Temp (!) 97.5 F (36.4 C) (Skin)   Ht 5' 6.5" (1.689 m)   Wt 120 lb (54.4 kg)   SpO2 100%   BMI 19.08 kg/m   CV: RRR without murmur, S1/S2 Resp: clear to auscultation bilaterally, normal RR and effort noted GI: soft, no tenderness, with active bowel sounds. Patient with normal speech pattern, mentation and orientation normal.  Normal gross motor function, extraocular muscles intact.  Assessment: Encounter Diagnosis  Name Primary?   Personal history of colonic polyps Yes     Plan: Colonoscopy  The benefits and risks of the planned procedure were described in detail with the patient or (when appropriate) their health care proxy.  Risks were outlined as including, but not limited to, bleeding, infection, perforation, adverse medication reaction leading to cardiac or pulmonary decompensation, pancreatitis (if ERCP).  The limitation of incomplete mucosal visualization was also discussed.  No guarantees or warranties were given.    The patient is appropriate for an endoscopic  procedure in the ambulatory setting.   - Wilfrid Lund, MD

## 2020-10-22 NOTE — Op Note (Signed)
[facility] Patient Name: Jacqueline Macias Procedure Date: 10/22/2020 3:55 PM MRN: [id number] Endoscopist: [place]. Danis , MD Age: 65 Referring MD:  Date of Birth: 12-16-1955 Gender: Female Account #: [id number] Procedure:                Colonoscopy Indications:              Increased risk colon cancer surveillance: Personal                            history of sessile serrated colon polyp (10 mm or                            greater in size)                           30-23m right colon SSP with LGD removed piecemeal                            with CSP on screening exam Jan 2022 - fair prep on                            that exam (Dr. AHavery Moros Medicines:                Monitored Anesthesia Care Procedure:                Pre-Anesthesia Assessment:                           - Prior to the procedure, a History and Physical                            was performed, and patient medications and                            allergies were reviewed. The patient's tolerance of                            previous anesthesia was also reviewed. The risks                            and benefits of the procedure and the sedation                            options and risks were discussed with the patient.                            All questions were answered, and informed consent                            was obtained. Prior Anticoagulants: The patient has                            taken no previous anticoagulant or antiplatelet  agents. ASA Grade Assessment: II - A patient with                            mild systemic disease. After reviewing the risks                            and benefits, the patient was deemed in                            satisfactory condition to undergo the procedure.                           After obtaining informed consent, the colonoscope                            was passed under direct vision. Throughout the                             procedure, the patient's blood pressure, pulse, and                            oxygen saturations were monitored continuously. The                            Olympus CF-HQ190L ([id number]) Colonoscope was                            introduced through the anus and advanced to the the                            cecum, identified by appendiceal orifice and                            ileocecal valve. The colonoscopy was somewhat                            difficult due to a redundant colon. Successful                            completion of the procedure was aided by using                            manual pressure and straightening and shortening                            the scope to obtain bowel loop reduction. The                            patient tolerated the procedure well. The quality                            of the bowel preparation was good.(small amount of  fibrous material and small seeds in the proximal                            right colon that could not be completely cleared)                            The ileocecal valve, appendiceal orifice, and                            rectum were photographed. The bowel preparation                            used was 2 day Suprep/Miralax. Scope In: 4:04:48 PM Scope Out: 4:26:57 PM Scope Withdrawal Time: 0 hours 15 minutes 29 seconds  Total Procedure Duration: 0 hours 22 minutes 9 seconds  Findings:                 The perianal and digital rectal examinations were                            normal.                           A tattoo was seen in the proximal ascending colon.                            A post-polypectomy scar was found near the tattoo                            site. There was no evidence of residual polyp                            tissue (WL and NBI). Right colon retroflexion                            performed to examin proximal side of fold at                            polypectomy  site.                           The exam was otherwise without abnormality on                            direct and retroflexion views. Complications:            No immediate complications. Estimated Blood Loss:     Estimated blood loss: none. Impression:               - A tattoo was seen in the proximal ascending                            colon. A post-polypectomy scar was found at the  tattoo site. There was no evidence of residual                            polyp tissue.                           - The examination was otherwise normal on direct                            and retroflexion views.                           - No specimens collected. Recommendation:           - Patient has a contact number available for                            emergencies. The signs and symptoms of potential                            delayed complications were discussed with the                            patient. Return to normal activities tomorrow.                            Written discharge instructions were provided to the                            patient.                           - Resume previous diet.                           - Continue present medications.                           - Repeat colonoscopy in 2 years with Dr. Havery Moros                            for surveillance. Damyah Gugel L. Loletha Carrow, MD 10/22/2020 4:38:20 PM This report has been signed electronically.

## 2020-10-22 NOTE — Progress Notes (Signed)
Pt's states no medical or surgical changes since previsit or office visit. VS assessed by D.T 

## 2020-10-22 NOTE — Progress Notes (Signed)
Sedate, gd SR, tolerated procedure well, VSS, report to RN 

## 2020-10-26 ENCOUNTER — Telehealth: Payer: Self-pay

## 2020-10-26 ENCOUNTER — Telehealth: Payer: Self-pay | Admitting: *Deleted

## 2020-10-26 NOTE — Telephone Encounter (Signed)
No answer for post procedure call back. Left voicemail.   

## 2020-10-26 NOTE — Telephone Encounter (Signed)
Left message on follow up call. 

## 2020-11-14 DIAGNOSIS — A498 Other bacterial infections of unspecified site: Secondary | ICD-10-CM

## 2020-11-14 HISTORY — DX: Other bacterial infections of unspecified site: A49.8

## 2020-11-21 ENCOUNTER — Inpatient Hospital Stay (HOSPITAL_COMMUNITY)
Admission: EM | Admit: 2020-11-21 | Discharge: 2020-11-24 | DRG: 394 | Disposition: A | Discharge status: Home / Self Care | Payer: 59 | Attending: Internal Medicine | Admitting: Internal Medicine

## 2020-11-21 ENCOUNTER — Emergency Department (HOSPITAL_COMMUNITY): Payer: 59

## 2020-11-21 ENCOUNTER — Other Ambulatory Visit: Payer: Self-pay

## 2020-11-21 ENCOUNTER — Encounter (HOSPITAL_COMMUNITY): Payer: Self-pay | Admitting: Emergency Medicine

## 2020-11-21 DIAGNOSIS — Z681 Body mass index (BMI) 19 or less, adult: Secondary | ICD-10-CM

## 2020-11-21 DIAGNOSIS — Z86010 Personal history of colonic polyps: Secondary | ICD-10-CM

## 2020-11-21 DIAGNOSIS — K559 Vascular disorder of intestine, unspecified: Principal | ICD-10-CM | POA: Diagnosis present

## 2020-11-21 DIAGNOSIS — K921 Melena: Secondary | ICD-10-CM | POA: Diagnosis present

## 2020-11-21 DIAGNOSIS — Z20822 Contact with and (suspected) exposure to covid-19: Secondary | ICD-10-CM | POA: Diagnosis present

## 2020-11-21 DIAGNOSIS — K529 Noninfective gastroenteritis and colitis, unspecified: Secondary | ICD-10-CM | POA: Diagnosis present

## 2020-11-21 DIAGNOSIS — E871 Hypo-osmolality and hyponatremia: Secondary | ICD-10-CM | POA: Diagnosis present

## 2020-11-21 DIAGNOSIS — Z87891 Personal history of nicotine dependence: Secondary | ICD-10-CM

## 2020-11-21 DIAGNOSIS — R1110 Vomiting, unspecified: Secondary | ICD-10-CM

## 2020-11-21 DIAGNOSIS — R197 Diarrhea, unspecified: Secondary | ICD-10-CM

## 2020-11-21 DIAGNOSIS — R630 Anorexia: Secondary | ICD-10-CM | POA: Diagnosis present

## 2020-11-21 DIAGNOSIS — Z79899 Other long term (current) drug therapy: Secondary | ICD-10-CM

## 2020-11-21 DIAGNOSIS — Z7983 Long term (current) use of bisphosphonates: Secondary | ICD-10-CM

## 2020-11-21 LAB — COMPREHENSIVE METABOLIC PANEL
ALT: 57 U/L — ABNORMAL HIGH (ref 0–44)
AST: 44 U/L — ABNORMAL HIGH (ref 15–41)
Albumin: 3.2 g/dL — ABNORMAL LOW (ref 3.5–5.0)
Alkaline Phosphatase: 65 U/L (ref 38–126)
Anion gap: 10 (ref 5–15)
BUN: 19 mg/dL (ref 8–23)
CO2: 27 mmol/L (ref 22–32)
Calcium: 8.6 mg/dL — ABNORMAL LOW (ref 8.9–10.3)
Chloride: 95 mmol/L — ABNORMAL LOW (ref 98–111)
Creatinine, Ser: 0.93 mg/dL (ref 0.44–1.00)
GFR, Estimated: >60 mL/min (ref >60)
Glucose, Bld: 137 mg/dL — ABNORMAL HIGH (ref 70–99)
Potassium: 3.7 mmol/L (ref 3.5–5.1)
Sodium: 132 mmol/L — ABNORMAL LOW (ref 135–145)
Total Bilirubin: 0.6 mg/dL (ref 0.3–1.2)
Total Protein: 6.1 g/dL — ABNORMAL LOW (ref 6.5–8.1)

## 2020-11-21 LAB — CBC WITH DIFFERENTIAL/PLATELET
Abs Immature Granulocytes: 0.06 10*3/uL (ref 0.00–0.07)
Basophils Absolute: 0 10*3/uL (ref 0.0–0.1)
Basophils Relative: 0 %
Eosinophils Absolute: 0 10*3/uL (ref 0.0–0.5)
Eosinophils Relative: 0 %
HCT: 46.2 % — ABNORMAL HIGH (ref 36.0–46.0)
Hemoglobin: 15 g/dL (ref 12.0–15.0)
Immature Granulocytes: 0 %
Lymphocytes Relative: 9 %
Lymphs Abs: 1.3 10*3/uL (ref 0.7–4.0)
MCH: 30.2 pg (ref 26.0–34.0)
MCHC: 32.5 g/dL (ref 30.0–36.0)
MCV: 93.1 fL (ref 80.0–100.0)
Monocytes Absolute: 1.1 10*3/uL — ABNORMAL HIGH (ref 0.1–1.0)
Monocytes Relative: 8 %
Neutro Abs: 11.2 10*3/uL — ABNORMAL HIGH (ref 1.7–7.7)
Neutrophils Relative %: 83 %
Platelets: 353 10*3/uL (ref 150–400)
RBC: 4.96 MIL/uL (ref 3.87–5.11)
RDW: 12.8 % (ref 11.5–15.5)
WBC: 13.7 10*3/uL — ABNORMAL HIGH (ref 4.0–10.5)
nRBC: 0 % (ref 0.0–0.2)

## 2020-11-21 LAB — POC OCCULT BLOOD, ED: Fecal Occult Bld: POSITIVE — AB

## 2020-11-21 LAB — URINALYSIS, ROUTINE W REFLEX MICROSCOPIC
Bacteria, UA: NONE SEEN
Bilirubin Urine: NEGATIVE
Glucose, UA: NEGATIVE mg/dL
Ketones, ur: 5 mg/dL — AB
Leukocytes,Ua: NEGATIVE
Nitrite: NEGATIVE
Protein, ur: 100 mg/dL — AB
Specific Gravity, Urine: 1.02 (ref 1.005–1.030)
pH: 6 (ref 5.0–8.0)

## 2020-11-21 LAB — RESP PANEL BY RT-PCR (FLU A&B, COVID) ARPGX2
Influenza A by PCR: NEGATIVE
Influenza B by PCR: NEGATIVE
SARS Coronavirus 2 by RT PCR: NEGATIVE

## 2020-11-21 LAB — LIPASE, BLOOD: Lipase: 36 U/L (ref 11–51)

## 2020-11-21 MED ORDER — ACETAMINOPHEN 650 MG RE SUPP: 650.0000 mg | Freq: Four times a day (QID) | RECTAL | Status: DC | PRN

## 2020-11-21 MED ORDER — SODIUM CHLORIDE 0.9 % IV SOLN
2.0000 g | Freq: Once | INTRAVENOUS | Status: AC
  Administered 2020-11-21: 2 g via INTRAVENOUS
  Filled 2020-11-21: qty 20

## 2020-11-21 MED ORDER — ONDANSETRON HCL 4 MG/2ML IJ SOLN
4.0000 mg | Freq: Four times a day (QID) | INTRAMUSCULAR | Status: DC | PRN
  Administered 2020-11-21 – 2020-11-22 (×2): 4 mg via INTRAVENOUS
  Filled 2020-11-21 (×2): qty 2

## 2020-11-21 MED ORDER — LACTATED RINGERS IV BOLUS
1000.0000 mL | Freq: Once | INTRAVENOUS | Status: AC
  Administered 2020-11-21: 1000 mL via INTRAVENOUS

## 2020-11-21 MED ORDER — METRONIDAZOLE 500 MG/100ML IV SOLN
500.0000 mg | Freq: Once | INTRAVENOUS | Status: AC
  Administered 2020-11-21: 500 mg via INTRAVENOUS
  Filled 2020-11-21: qty 100

## 2020-11-21 MED ORDER — ONDANSETRON HCL 4 MG/2ML IJ SOLN
4.0000 mg | Freq: Once | INTRAMUSCULAR | Status: AC
  Administered 2020-11-21: 4 mg via INTRAVENOUS
  Filled 2020-11-21: qty 2

## 2020-11-21 MED ORDER — ALPRAZOLAM 0.5 MG PO TABS
0.5000 mg | ORAL_TABLET | Freq: Every evening | ORAL | Status: DC | PRN
  Administered 2020-11-21: 0.5 mg via ORAL
  Filled 2020-11-21: qty 1

## 2020-11-21 MED ORDER — SIMETHICONE 80 MG PO CHEW
80.0000 mg | CHEWABLE_TABLET | Freq: Four times a day (QID) | ORAL | Status: DC | PRN
  Administered 2020-11-21 – 2020-11-22 (×3): 80 mg via ORAL
  Filled 2020-11-21 (×4): qty 1

## 2020-11-21 MED ORDER — HYDROMORPHONE HCL 1 MG/ML IJ SOLN
1.0000 mg | Freq: Once | INTRAMUSCULAR | Status: AC
  Administered 2020-11-21: 1 mg via INTRAVENOUS
  Filled 2020-11-21: qty 1

## 2020-11-21 MED ORDER — SODIUM CHLORIDE 0.9 % IV SOLN: Freq: Once | INTRAVENOUS | Status: AC

## 2020-11-21 MED ORDER — ACETAMINOPHEN 325 MG PO TABS
650.0000 mg | ORAL_TABLET | Freq: Four times a day (QID) | ORAL | Status: DC | PRN
  Administered 2020-11-21 – 2020-11-22 (×3): 650 mg via ORAL
  Filled 2020-11-21 (×3): qty 2

## 2020-11-21 MED ORDER — ONDANSETRON HCL 4 MG PO TABS: 4.0000 mg | ORAL_TABLET | Freq: Four times a day (QID) | ORAL | Status: DC | PRN

## 2020-11-21 MED ORDER — FAMOTIDINE 20 MG PO TABS
20.0000 mg | ORAL_TABLET | Freq: Every day | ORAL | Status: DC
  Administered 2020-11-21 – 2020-11-22 (×2): 20 mg via ORAL
  Filled 2020-11-21 (×2): qty 1

## 2020-11-21 MED ORDER — LACTATED RINGERS IV SOLN: INTRAVENOUS | Status: AC

## 2020-11-21 NOTE — ED Triage Notes (Signed)
Pt reports generalized abd pain x 3 days with nausea, vomiting, diarrhea, and blood in stool when she wipes.

## 2020-11-21 NOTE — H&P (Addendum)
Date: 11/21/2020               Patient Name:  Jacqueline Macias MRN: [id number]  DOB: 1956-01-20 Age / Sex: 65 y.o., female   PCP: Barbette Or, MD         Medical Service: Internal Medicine Teaching Service         Attending Physician: Lucious Groves, DO    First Contact: Merrily Brittle, DO Pager: [phone]  Second Contact: Jose Persia, MD Pager: [phone]       After Hours (After 5p/  First Contact Pager: [phone]  weekends / holidays): Second Contact Pager: [phone]   SUBJECTIVE  Chief Complaint: Abdominal Pain   History of Present Illness:  Ms. Jacqueline Macias is a 65 y.o. female, with pertinent PMHx of chronic constipation, presents to [facility] (11/21/2020) with abdominal pain and bloody stool that started about 2 days ago after taking OTC Magnesium supplements to relieve her chronic constipation. States that since then she has had constant loose stools with bright red blood per rectum with associated nausea, vomiting, abdominal pain, abdominal cramping, and anorexia. States that she used OTC Imodium to relieve the diarrhea, was unsuccessful. States that she is to nauseous to eat. She denies recent travel, hiking, sick contacts, change in diet, change in medicines, and anyone in proximity with similar symptoms.  States that this has never happened to her before.  She denies fevers, chills, urinary frequency dysuria, urgency, chest pain, shortness of breath, weakness, headaches.  Of note, patient has a history of SSP in right colon on colonoscopy Jan 2022 which was removed. Last colonoscopy on 10/22/2020 was negative for polyps, inflammation, diverticulosis. Also reports chronic mild transaminitis and benign hemangioma.  ED Course:  Patient was noted to be normotensive at 133/97, tachycardic at 108. Afebrile at 97.9.  Saturating at 100% on RA.   Initial blood work remarkable for leukocytosis, mild hyponatremia, and positive fecal occult. IMTS paged for  admission.  Meds:  States the only medicines she currently takes are alendronate weekly and Xanax nightly for sleep.  Current Outpatient Medications  Medication Instructions   alendronate (FOSAMAX) 70 mg, Oral, Weekly   ALPRAZolam (XANAX) 0.5 mg, Oral, At bedtime PRN   fluticasone (FLONASE) 50 MCG/ACT nasal spray 2 sprays, Each Nare, Daily   nitroGLYCERIN (NITRODUR - DOSED IN MG/24 HR) 0.2 mg/hr patch USE 1/4 PATCH TO THE AFFECTED AREA DAILY AS DIRECTED     Allergies: Allergies as of 11/21/2020   (No Known Allergies)    Past Medical History: History reviewed. No pertinent past medical history. Past Surgical History:  Procedure Laterality Date   BREAST ENHANCEMENT SURGERY     CESAREAN SECTION     COLONOSCOPY  2007   Dr.Ganem Eagle normal exam per pt   COLONOSCOPY  02/28/2020   DILATION AND CURETTAGE OF UTERUS     polyp    Family History: Family History  Problem Relation Age of Onset   Breast cancer Mother 48   Other Mother        TAH in 42s   Lymphoma Father 85   Diverticulitis Father    Breast cancer Maternal Aunt        dx. 76-78   Heart attack Maternal Grandfather    Dementia Paternal Grandmother    Dementia Paternal Grandfather    Breast cancer Maternal Aunt 80   Breast cancer Other    Colon cancer Neg Hx    Esophageal cancer Neg Hx  Rectal cancer Neg Hx    Stomach cancer Neg Hx    Colon polyps Neg Hx      Social History: Independent in her ADLs and IADLs. Lives at home with husband and 2 sons who are supportive. Currently working  Social History   Tobacco Use   Smoking status: Former    Packs/day: 0.50    Years: 5.00    Pack years: 2.50    Types: Cigarettes    Quit date: 02/14/1977    Years since quitting: 43.7   Smokeless tobacco: Never  Vaping Use   Vaping Use: Never used  Substance Use Topics   Alcohol use: Yes    Alcohol/week: 1.0 standard drink    Types: 1 Glasses of wine per week   Drug use: Not Currently     OBJECTIVE:  Review  of Systems: A complete ROS was negative except as per HPI.   Physical Exam: Blood pressure (!) 153/96, pulse 74, temperature 97.9 F (36.6 C), temperature source Oral, resp. rate 16, SpO2 94 %. Physical Exam Vitals and nursing note reviewed.  Constitutional:      General: She is not in acute distress.    Appearance: She is not ill-appearing or diaphoretic.  HENT:     Head: Normocephalic and atraumatic.  Eyes:     Extraocular Movements: Extraocular movements intact.     Pupils: Pupils are equal, round, and reactive to light.  Cardiovascular:     Rate and Rhythm: Normal rate and regular rhythm.     Heart sounds: Normal heart sounds.  Pulmonary:     Effort: Pulmonary effort is normal. No respiratory distress.     Breath sounds: Normal breath sounds. No wheezing or rales.  Abdominal:     General: Abdomen is flat. Bowel sounds are normal. There is no distension.     Palpations: Abdomen is soft.     Tenderness: There is abdominal tenderness in the right lower quadrant and left lower quadrant. There is no guarding or rebound.     Hernia: No hernia is present.  Skin:    General: Skin is warm and dry.  Neurological:     General: No focal deficit present.     Mental Status: She is alert.  Psychiatric:        Mood and Affect: Mood normal.        Behavior: Behavior normal.    Pertinent Labs: CBC    Component Value Date/Time   WBC 13.7 (H) 11/21/2020 0836   RBC 4.96 11/21/2020 0836   HGB 15.0 11/21/2020 0836   HCT 46.2 (H) 11/21/2020 0836   PLT 353 11/21/2020 0836   MCV 93.1 11/21/2020 0836   MCV 91.3 11/30/2014 1724   MCH 30.2 11/21/2020 0836   MCHC 32.5 11/21/2020 0836   RDW 12.8 11/21/2020 0836   LYMPHSABS 1.3 11/21/2020 0836   MONOABS 1.1 (H) 11/21/2020 0836   EOSABS 0.0 11/21/2020 0836   BASOSABS 0.0 11/21/2020 0836     CMP     Component Value Date/Time   NA 132 (L) 11/21/2020 0836   K 3.7 11/21/2020 0836   CL 95 (L) 11/21/2020 0836   CO2 27 11/21/2020 0836    GLUCOSE 137 (H) 11/21/2020 0836   BUN 19 11/21/2020 0836   CREATININE 0.93 11/21/2020 0836   CREATININE 0.81 11/30/2014 1717   CALCIUM 8.6 (L) 11/21/2020 0836   PROT 6.1 (L) 11/21/2020 0836   ALBUMIN 3.2 (L) 11/21/2020 0836   AST 44 (H) 11/21/2020 9935  ALT 57 (H) 11/21/2020 0836   ALKPHOS 65 11/21/2020 0836   BILITOT 0.6 11/21/2020 0836   GFRNONAA >60 11/21/2020 0836    Pertinent Imaging: CT ABDOMEN PELVIS WO CONTRAST  Result Date: 11/21/2020 CLINICAL DATA:  Generalized abdomen pain for 3 days with nausea, vomiting and diarrhea. EXAM: CT ABDOMEN AND PELVIS WITHOUT CONTRAST TECHNIQUE: Multidetector CT imaging of the abdomen and pelvis was performed following the standard protocol without IV contrast. COMPARISON:  December 05, 2014 FINDINGS: Lower chest: No acute abnormality. Hepatobiliary: There is a low-density mass in the posterior liver unchanged compared to prior CT. There is a liver cyst near the gallbladder fossa unchanged. The gallbladder is distended. The biliary tree is unremarkable. Pancreas: Unremarkable. No pancreatic ductal dilatation or surrounding inflammatory changes. Spleen: Normal in size without focal abnormality. Adrenals/Urinary Tract: Adrenal glands are unremarkable. Kidneys are normal, without renal calculi, focal lesion, or hydronephrosis. Bladder is unremarkable. Stomach/Bowel: The colon is collapsed with no oral contrast; however, there is suggestion of diffuse bowel wall thickening of the transverse colon and the ascending colon with surrounding inflammation and fluid of the ascending colon. The small bowel is normal without evidence of small bowel obstruction. The stomach is unremarkable. The appendix is not seen. Vascular/Lymphatic: No significant vascular findings are present. No enlarged abdominal or pelvic lymph nodes. Reproductive: Uterus and bilateral adnexa are unremarkable. Other: Ascites is identified in the abdomen and pelvis. Musculoskeletal: Degenerative joint  changes of thoracic spine are noted. IMPRESSION: 1. The colon is collapsed with no oral contrast; however, there is suggestion of diffuse bowel wall thickening of the transverse colon and the ascending colon with surrounding inflammation and fluid of the ascending colon. The findings are nonspecific but can be seen in colitis. 2. Ascites in the abdomen and pelvis. Electronically Signed   By: Abelardo Diesel M.D.   On: 11/21/2020 11:15     ASSESSMENT & PLAN:  Assessment & Plan by Problem: Principal Problem:   Acute colitis   Ms. Markeisha Mancias is a 65 y.o. female, with no pertinent PMHx, presents to [facility] (11/21/2020) with abdominal pain and bloody stool for about 2 days.  #Acute colitis of ascending and transverse colon Evident on CT.  Fecal occult positive.  Likely viral in etiology. Colonoscopy last month, was negative for bleeding, inflammation, polyps, diverticulosis. She is mildly tachycardic, likely due to dehydration from diarrhea, otherwise afebrile and VSS. Hemoglobin WNL, leukocytosis present. Will rule out C. difficile, and GI panel pending.  We will provide supportive care with IVF, clear liquid diet, and symptomatic control. Follow-up C. Difficile Follow-up GI panel Continue IV LR 125cc/h Repeat CBC in AM  #Chronic mild transaminitis Has been stable for years. We will continue to monitor.   Dispo: Admit patient to Observation with expected length of stay less than 2 midnights.  Signed: Merrily Brittle, DO Psychiatry Resident, PGY-1 [facility] Teaching Service Pager: [phone] 7:28 PM, 11/21/2020   After 5pm on weekdays and 1pm on weekends: On Call pager: [phone]

## 2020-11-21 NOTE — ED Notes (Signed)
Attempted to call report. No answer at this time.

## 2020-11-21 NOTE — ED Provider Notes (Signed)
Emergency Medicine Provider Triage Evaluation Note  Jacqueline Macias , a 65 y.o. female  was evaluated in triage.  Pt complains of lower abdominal cramping for the past 3 days.  Constant, without much relief.  Does take magnesium pills orally daily in order to have regular bowel movements, however increase her dose to 3 pills in the morning and 3 pills at night of 250 mg.  Has noted blood in her stool, this is bright red, noted in the toilet bowl.  Has taken Zofran for nausea without any improvement in symptoms.  Also endorses anorexia, with her last food intake approximately 2 days ago.  Also continues to endorse diarrhea, nonbilious emesis.  No prior surgical history to her abdomen.  No prior history of hemorrhoids or diverticulitis.  Review of Systems  Positive: Diarrhea, vomiting, anorexia, nausea, lower abdominal cramping Negative: Urinary symptoms, fever, shortness of breath, chest pain, back pain  Physical Exam  BP (!) 133/97 (BP Location: Left Arm)   Pulse (!) 108   Temp 97.9 F (36.6 C) (Oral)   Resp 17   SpO2 100%  Gen:   Awake, no distress   Resp:  Normal effort  MSK:   Moves extremities without difficulty  Other:  TTP along the whole lower abdomen, no CVA tenderness.   Medical Decision Making  Medically screening exam initiated at 8:24 AM.  Appropriate orders placed.  Jacqueline Macias was informed that the remainder of the evaluation will be completed by another provider, this initial triage assessment does not replace that evaluation, and the importance of remaining in the ED until their evaluation is complete.  Patient here with blood in her stools, lower abdominal cramping for the past 2 days, no relief with Zofran.  Also endorsing diarrhea.  Some suspicion for diverticulitis versus the patient.  Labs along with CT abdomen have been ordered   Jacqueline Fitting, PA-C 11/21/20 0827    Carmin Muskrat, MD 11/21/20 [phone]

## 2020-11-21 NOTE — ED Provider Notes (Signed)
Seen after prior EDP.  Patient would benefit from admission.  Patient agrees with plan.  Medicine service is aware of case and will evaluate for same.   Valarie Merino, MD 11/21/20 [phone]

## 2020-11-21 NOTE — ED Notes (Signed)
Report given to RN receiving pt in room 6N30.

## 2020-11-21 NOTE — ED Provider Notes (Signed)
[facility] EMERGENCY DEPARTMENT Provider Note   CSN: [id number] Arrival date & time: 11/21/20  0815     History Chief Complaint  Patient presents with   Abdominal Pain    Jacqueline Macias is a 65 y.o. female.   Abdominal Pain Pain location:  Generalized Pain quality: cramping   Pain radiates to:  Does not radiate Pain severity:  Moderate Onset quality:  Gradual Duration:  3 days Timing:  Intermittent Progression:  Waxing and waning Chronicity:  New Context: not suspicious food intake   Relieved by:  Nothing Associated symptoms: anorexia, diarrhea, hematochezia, nausea and vomiting   Associated symptoms: no chest pain, no chills, no fever, no hematemesis, no melena and no shortness of breath    65 year old female presenting to the emergency department with roughly 3 days of abdominal cramping.  She endorses diffuse cramping that has been constant without significant relief.  She had been taking daily magnesium pills in order to have regular bowel movements.  She recently increased her dose.  She did notice some blood in her stool and some loosening of her stools and then started to take Imodium.  She subsequently developed worsening crampy abdominal pain and now endorses continued diarrhea with hematochezia.  She endorses NBNB emesis.  She denies any fevers or chills.  She denies a history of hemorrhoids or diverticulitis.  She denies any suspect food intake.  She has been unable to eat or drink for the past 2 days.  History reviewed. No pertinent past medical history.  Patient Active Problem List   Diagnosis Date Noted   Posterior knee pain, right 01/02/2020   Genetic testing 12/05/2014   Family history of breast cancer in mother 11/06/2014   Family history of breast cancer in female 11/06/2014    Past Surgical History:  Procedure Laterality Date   BREAST ENHANCEMENT SURGERY     CESAREAN SECTION     COLONOSCOPY  2007   Dr.Ganem Eagle normal  exam per pt   COLONOSCOPY  02/28/2020   DILATION AND CURETTAGE OF UTERUS     polyp     OB History   No obstetric history on file.     Family History  Problem Relation Age of Onset   Breast cancer Mother 18   Other Mother        TAH in 13s   Lymphoma Father 45   Diverticulitis Father    Breast cancer Maternal Aunt        dx. 76-78   Heart attack Maternal Grandfather    Dementia Paternal Grandmother    Dementia Paternal Grandfather    Breast cancer Maternal Aunt 80   Breast cancer Other    Colon cancer Neg Hx    Esophageal cancer Neg Hx    Rectal cancer Neg Hx    Stomach cancer Neg Hx    Colon polyps Neg Hx     Social History   Tobacco Use   Smoking status: Former    Packs/day: 0.50    Years: 5.00    Pack years: 2.50    Types: Cigarettes    Quit date: 02/14/1977    Years since quitting: 43.7   Smokeless tobacco: Never  Vaping Use   Vaping Use: Never used  Substance Use Topics   Alcohol use: Yes    Alcohol/week: 1.0 standard drink    Types: 1 Glasses of wine per week   Drug use: Not Currently    Home Medications Prior to Admission medications  Medication Sig Start Date End Date Taking? Authorizing Provider  alendronate (FOSAMAX) 70 MG tablet Take 70 mg by mouth once a week. 11/25/19  Yes [provider]  ALPRAZolam Duanne Moron) 0.5 MG tablet Take 0.5 mg by mouth at bedtime as needed for anxiety or sleep.   Yes [provider]  fluticasone (FLONASE) 50 MCG/ACT nasal spray Place 2 sprays into both nostrils daily. Patient taking differently: Place 2 sprays into both nostrils daily as needed for allergies. 05/06/16  Yes Timmothy Euler, [place] D, PA-C  nitroGLYCERIN (NITRODUR - DOSED IN MG/24 HR) 0.2 mg/hr patch USE 1/4 PATCH TO THE AFFECTED AREA DAILY AS DIRECTED Patient not taking: No sig reported 03/04/20   Stefanie Libel, MD    Allergies    Patient has no known allergies.  Review of Systems   Review of Systems  Constitutional:  Negative for chills  and fever.  Respiratory:  Negative for shortness of breath.   Cardiovascular:  Negative for chest pain.  Gastrointestinal:  Positive for abdominal pain, anorexia, diarrhea, hematochezia, nausea and vomiting. Negative for hematemesis and melena.  All other systems reviewed and are negative.  Physical Exam Updated Vital Signs BP (!) 153/96 (BP Location: Left Arm)   Pulse 74   Temp 97.9 F (36.6 C) (Oral)   Resp 16   SpO2 94%   Physical Exam Vitals and nursing note reviewed. Exam conducted with a chaperone present.  Constitutional:      General: She is not in acute distress.    Appearance: She is well-developed.  HENT:     Head: Normocephalic and atraumatic.  Eyes:     Conjunctiva/sclera: Conjunctivae normal.  Cardiovascular:     Rate and Rhythm: Normal rate and regular rhythm.     Heart sounds: No murmur heard. Pulmonary:     Effort: Pulmonary effort is normal. No respiratory distress.     Breath sounds: Normal breath sounds.  Abdominal:     Palpations: Abdomen is soft.     Tenderness: There is generalized abdominal tenderness. There is guarding.  Genitourinary:    Rectum: Guaiac result positive. No external hemorrhoid or internal hemorrhoid.     Comments: Gross blood noted on the glove, Hemoccult positive Musculoskeletal:     Cervical back: Neck supple.  Skin:    General: Skin is warm and dry.  Neurological:     Mental Status: She is alert.    ED Results / Procedures / Treatments   Labs (all labs ordered are listed, but only abnormal results are displayed) Labs Reviewed  COMPREHENSIVE METABOLIC PANEL - Abnormal; Notable for the following components:      Result Value   Sodium 132 (*)    Chloride 95 (*)    Glucose, Bld 137 (*)    Calcium 8.6 (*)    Total Protein 6.1 (*)    Albumin 3.2 (*)    AST 44 (*)    ALT 57 (*)    All other components within normal limits  CBC WITH DIFFERENTIAL/PLATELET - Abnormal; Notable for the following components:   WBC 13.7 (*)     HCT 46.2 (*)    Neutro Abs 11.2 (*)    Monocytes Absolute 1.1 (*)    All other components within normal limits  URINALYSIS, ROUTINE W REFLEX MICROSCOPIC - Abnormal; Notable for the following components:   APPearance HAZY (*)    Hgb urine dipstick SMALL (*)    Ketones, ur 5 (*)    Protein, ur 100 (*)    All  other components within normal limits  POC OCCULT BLOOD, ED - Abnormal; Notable for the following components:   Fecal Occult Bld POSITIVE (*)    All other components within normal limits  GASTROINTESTINAL PANEL BY PCR, STOOL (REPLACES STOOL CULTURE)  C DIFFICILE QUICK SCREEN W PCR REFLEX    RESP PANEL BY RT-PCR (FLU A&B, COVID) ARPGX2  LIPASE, BLOOD  OCCULT BLOOD X 1 CARD TO LAB, STOOL    EKG None  Radiology CT ABDOMEN PELVIS WO CONTRAST  Result Date: 11/21/2020 CLINICAL DATA:  Generalized abdomen pain for 3 days with nausea, vomiting and diarrhea. EXAM: CT ABDOMEN AND PELVIS WITHOUT CONTRAST TECHNIQUE: Multidetector CT imaging of the abdomen and pelvis was performed following the standard protocol without IV contrast. COMPARISON:  December 05, 2014 FINDINGS: Lower chest: No acute abnormality. Hepatobiliary: There is a low-density mass in the posterior liver unchanged compared to prior CT. There is a liver cyst near the gallbladder fossa unchanged. The gallbladder is distended. The biliary tree is unremarkable. Pancreas: Unremarkable. No pancreatic ductal dilatation or surrounding inflammatory changes. Spleen: Normal in size without focal abnormality. Adrenals/Urinary Tract: Adrenal glands are unremarkable. Kidneys are normal, without renal calculi, focal lesion, or hydronephrosis. Bladder is unremarkable. Stomach/Bowel: The colon is collapsed with no oral contrast; however, there is suggestion of diffuse bowel wall thickening of the transverse colon and the ascending colon with surrounding inflammation and fluid of the ascending colon. The small bowel is normal without evidence of small  bowel obstruction. The stomach is unremarkable. The appendix is not seen. Vascular/Lymphatic: No significant vascular findings are present. No enlarged abdominal or pelvic lymph nodes. Reproductive: Uterus and bilateral adnexa are unremarkable. Other: Ascites is identified in the abdomen and pelvis. Musculoskeletal: Degenerative joint changes of thoracic spine are noted. IMPRESSION: 1. The colon is collapsed with no oral contrast; however, there is suggestion of diffuse bowel wall thickening of the transverse colon and the ascending colon with surrounding inflammation and fluid of the ascending colon. The findings are nonspecific but can be seen in colitis. 2. Ascites in the abdomen and pelvis. Electronically Signed   By: Abelardo Diesel M.D.   On: 11/21/2020 11:15    Procedures Procedures   Medications Ordered in ED Medications  cefTRIAXone (ROCEPHIN) 2 g in sodium chloride 0.9 % 100 mL IVPB (0 g Intravenous Stopped 11/21/20 1708)    And  metroNIDAZOLE (FLAGYL) IVPB 500 mg (500 mg Intravenous New Bag/Given 11/21/20 1718)  0.9 %  sodium chloride infusion (has no administration in time range)  lactated ringers bolus 1,000 mL (1,000 mLs Intravenous New Bag/Given 11/21/20 1453)  ondansetron (ZOFRAN) injection 4 mg (4 mg Intravenous Given 11/21/20 1458)  HYDROmorphone (DILAUDID) injection 1 mg (1 mg Intravenous Given 11/21/20 1458)    ED Course  I have reviewed the triage vital signs and the nursing notes.  Pertinent labs & imaging results that were available during my care of the patient were reviewed by me and considered in my medical decision making (see chart for details).    MDM Rules/Calculators/A&P                           65 year old female presenting to the emergency department with roughly 3 days of abdominal cramping, bloody diarrhea and anorexia.  On arrival, the patient was afebrile, mildly tachycardic P107, BP 133/97.  Physical exam significant for diffuse abdominal tenderness to  palpation.  Rectal exam revealed bright red blood per rectum on the glove  that was Hemoccult positive.  Differential diagnosis includes colitis (ischemic versus infectious), GI bleed, GI malignancy, diarrheal illness with diverticular bleed.  CT abdomen pelvis was performed Which revealed diffuse bowel wall thickening of the transverse and ascending colon with surrounding inflammation and fluid which can be seen in colitis.  Screening labs were performed which revealed a mild leukocytosis to 13.7, elevated LFTs, hyponatremia, normal renal function, anion gap 10 with a bicarb of 27, stable hemoglobin at 15.  The patient was administered an LR bolus, IV Zofran, IV Dilaudid.  Plan to follow-up the patient's symptoms and reassess.  Given the patient's poor oral intake, consider admission for IV antibiotics and IV hydration in the setting of a potential infectious colitis.  Signout given to Dr. Francia Greaves at [phone].  Final Clinical Impression(s) / ED Diagnoses Final diagnoses:  Colitis  Bloody diarrhea    Rx / DC Orders ED Discharge Orders     None        Regan Lemming, MD 11/21/20 1718

## 2020-11-21 NOTE — Hospital Course (Addendum)
Hospital Course by problem list: Ms. Jacqueline Macias is a 65 y.o. female, with no pertinent PMHx, presents to [facility] (11/21/2020) with abdominal pain and bloody stool for about 2 days. LOS: 3 days  In the ED, Patient was noted to be normotensive at 133/97, tachycardic at 108. Afebrile at 97.9.  Saturating at 100% on RA.   Initial blood work remarkable for leukocytosis, mild hyponatremia, elevated AST/ALT and positive fecal occult.  She was started on empiric IV antibiotics ceftriaxone and metronidazole, also received IVF.   During hospitalization, GI panel was positive for STEC so antibiotics were discontinued. She was treated conservatively with aggressive IVF.treated hematocrit and platelets twice daily. Treated electrolytes and creatinine daily. Clinically improved within 2 days. Was found to be hyponatremic, that resolved with fluid. Had 1 event of projectile vomiting, that self resolved.  Did order abdominal x-ray, which showed possible focal ileus or early SBO.  However due to self resolution, no other interventions needed.   Consults: None  Plan:  #Acute colitis of ascending and transverse colon 2/2 STEC. Evident on CT. Fecal occult positive. Initially presumed to be viral in etiology, because colonoscopy last month, was negative for bleeding, inflammation, polyps, diverticulosis.  She presented mildly tachycardic, likely due to dehydration from diarrhea, otherwise afebrile and VSS. Hemoglobin WNL, leukocytosis present. C. difficile ruled out. GI panel was positive for STEC.  Monitored and trended PLT (<150), HCT (<30%), Cr for any progression to HUS. Discussed with family contact precautions.  Avoided antibiotics, meds with anti-mobility effects such as opioids, and any that impedes renal flow such as NSAIDs. Treated with supportive care with IVF, clear liquid diet, and symptomatic control. Continued IV LR 125cc/h Trended hematocrit, platelets Monitored Cr   #Acute asymptomatic  hypoosmotic hyponatremia, RESOLVED 2/2 GI loss from diarrhea x3 days. Improved since yesterday, patient is also having diarrhea. Expect Na to continue to improve. IVF as above Repeat BMP in AM  #Chronic mild transaminitis Has been stable for years. We will continue to monitor.

## 2020-11-22 ENCOUNTER — Observation Stay (HOSPITAL_COMMUNITY): Payer: 59

## 2020-11-22 DIAGNOSIS — K529 Noninfective gastroenteritis and colitis, unspecified: Secondary | ICD-10-CM | POA: Diagnosis not present

## 2020-11-22 LAB — CBC WITH DIFFERENTIAL/PLATELET
Abs Immature Granulocytes: 0.05 10*3/uL (ref 0.00–0.07)
Basophils Absolute: 0 10*3/uL (ref 0.0–0.1)
Basophils Relative: 0 %
Eosinophils Absolute: 0 10*3/uL (ref 0.0–0.5)
Eosinophils Relative: 0 %
HCT: 42.4 % (ref 36.0–46.0)
Hemoglobin: 14.3 g/dL (ref 12.0–15.0)
Immature Granulocytes: 0 %
Lymphocytes Relative: 8 %
Lymphs Abs: 1.1 10*3/uL (ref 0.7–4.0)
MCH: 30.2 pg (ref 26.0–34.0)
MCHC: 33.7 g/dL (ref 30.0–36.0)
MCV: 89.6 fL (ref 80.0–100.0)
Monocytes Absolute: 0.9 10*3/uL (ref 0.1–1.0)
Monocytes Relative: 6 %
Neutro Abs: 12.2 10*3/uL — ABNORMAL HIGH (ref 1.7–7.7)
Neutrophils Relative %: 86 %
Platelets: 280 10*3/uL (ref 150–400)
RBC: 4.73 MIL/uL (ref 3.87–5.11)
RDW: 12.3 % (ref 11.5–15.5)
WBC: 14.3 10*3/uL — ABNORMAL HIGH (ref 4.0–10.5)
nRBC: 0 % (ref 0.0–0.2)

## 2020-11-22 LAB — BASIC METABOLIC PANEL
Anion gap: 8 (ref 5–15)
BUN: 10 mg/dL (ref 8–23)
CO2: 25 mmol/L (ref 22–32)
Calcium: 7.8 mg/dL — ABNORMAL LOW (ref 8.9–10.3)
Chloride: 93 mmol/L — ABNORMAL LOW (ref 98–111)
Creatinine, Ser: 0.66 mg/dL (ref 0.44–1.00)
GFR, Estimated: >60 mL/min (ref >60)
Glucose, Bld: 108 mg/dL — ABNORMAL HIGH (ref 70–99)
Potassium: 3.7 mmol/L (ref 3.5–5.1)
Sodium: 126 mmol/L — ABNORMAL LOW (ref 135–145)

## 2020-11-22 LAB — GASTROINTESTINAL PANEL BY PCR, STOOL (REPLACES STOOL CULTURE)

## 2020-11-22 LAB — OSMOLALITY, URINE: Osmolality, Ur: 229 mOsm/kg — ABNORMAL LOW (ref 300–900)

## 2020-11-22 LAB — LACTIC ACID, PLASMA: Lactic Acid, Venous: 1.6 mmol/L (ref 0.5–1.9)

## 2020-11-22 LAB — HIV ANTIBODY (ROUTINE TESTING W REFLEX): HIV Screen 4th Generation wRfx: NONREACTIVE

## 2020-11-22 LAB — C DIFFICILE QUICK SCREEN W PCR REFLEX
C Diff antigen: NEGATIVE
C Diff interpretation: NOT DETECTED
C Diff toxin: NEGATIVE

## 2020-11-22 LAB — OSMOLALITY: Osmolality: 267 mOsm/kg — ABNORMAL LOW (ref 275–295)

## 2020-11-22 LAB — SODIUM, URINE, RANDOM: Sodium, Ur: 27 mmol/L

## 2020-11-22 MED ORDER — WHITE PETROLATUM EX OINT
TOPICAL_OINTMENT | CUTANEOUS | Status: AC
  Filled 2020-11-22: qty 28.35

## 2020-11-22 MED ORDER — HYDROMORPHONE HCL 1 MG/ML IJ SOLN
1.0000 mg | Freq: Once | INTRAMUSCULAR | Status: DC
  Filled 2020-11-22: qty 1

## 2020-11-22 MED ORDER — HYDROMORPHONE HCL 1 MG/ML IJ SOLN: 0.5000 mg | INTRAMUSCULAR | Status: DC | PRN

## 2020-11-22 MED ORDER — OXYCODONE-ACETAMINOPHEN 5-325 MG PO TABS
1.0000 | ORAL_TABLET | Freq: Four times a day (QID) | ORAL | Status: DC | PRN
  Filled 2020-11-22: qty 1

## 2020-11-22 MED ORDER — LACTATED RINGERS IV BOLUS
1000.0000 mL | Freq: Once | INTRAVENOUS | Status: AC
  Administered 2020-11-22: 1000 mL via INTRAVENOUS

## 2020-11-22 MED ORDER — LACTATED RINGERS IV SOLN: INTRAVENOUS | Status: AC

## 2020-11-22 MED ORDER — LACTATED RINGERS IV SOLN: INTRAVENOUS | Status: DC

## 2020-11-22 MED ORDER — SODIUM CHLORIDE 0.9 % IV SOLN
2.0000 g | INTRAVENOUS | Status: DC
  Administered 2020-11-22: 2 g via INTRAVENOUS
  Filled 2020-11-22: qty 20

## 2020-11-22 MED ORDER — ACETAMINOPHEN 500 MG PO TABS: 1000.0000 mg | ORAL_TABLET | Freq: Three times a day (TID) | ORAL | Status: DC

## 2020-11-22 MED ORDER — HYDROMORPHONE HCL 1 MG/ML IJ SOLN
0.5000 mg | INTRAMUSCULAR | Status: DC | PRN
  Administered 2020-11-22: 0.5 mg via INTRAVENOUS
  Filled 2020-11-22: qty 0.5

## 2020-11-22 MED ORDER — METRONIDAZOLE 500 MG/100ML IV SOLN
500.0000 mg | Freq: Two times a day (BID) | INTRAVENOUS | Status: DC
  Administered 2020-11-22: 500 mg via INTRAVENOUS
  Filled 2020-11-22: qty 100

## 2020-11-22 MED ORDER — TRAMADOL HCL 50 MG PO TABS
50.0000 mg | ORAL_TABLET | Freq: Four times a day (QID) | ORAL | Status: DC | PRN
  Administered 2020-11-22: 50 mg via ORAL
  Filled 2020-11-22: qty 1

## 2020-11-22 MED ORDER — ACETAMINOPHEN 10 MG/ML IV SOLN
1000.0000 mg | Freq: Four times a day (QID) | INTRAVENOUS | Status: AC
  Administered 2020-11-22 – 2020-11-23 (×4): 1000 mg via INTRAVENOUS
  Filled 2020-11-22 (×4): qty 100

## 2020-11-22 MED ORDER — POLYETHYLENE GLYCOL 3350 17 G PO PACK: 17.0000 g | PACK | Freq: Every day | ORAL | Status: DC | PRN

## 2020-11-22 MED ORDER — HYDROMORPHONE HCL 1 MG/ML IJ SOLN
0.5000 mg | INTRAMUSCULAR | Status: DC | PRN
  Filled 2020-11-22: qty 0.5

## 2020-11-22 MED ORDER — LORAZEPAM 2 MG/ML IJ SOLN
0.5000 mg | Freq: Every evening | INTRAMUSCULAR | Status: DC | PRN
  Administered 2020-11-22: 0.5 mg via INTRAVENOUS
  Filled 2020-11-22: qty 1

## 2020-11-22 NOTE — Progress Notes (Signed)
Stool sample sent to lab at this time.

## 2020-11-22 NOTE — Progress Notes (Signed)
HD 0 SUBJECTIVE:  Patient Summary: Jacqueline Macias is a 65 y.o. female, with a PMHx of chronic constipation, who presented to [facility] (11/21/2020) with 2 days of abdominal pain and bloody stool.  Overnight Events: None  Interim History: Jacqueline Macias was seen this AM with husband and son at bedside. States that she still has pain and multiple bowel movements, however the are no longer bloody.  Pain and amounts of bowel movements, are unchanged since the day prior. States that she was not able to sleep due to the pain and bowel movements.  Family requests for continued empiric antibiotics until GI panel results, tramadol for pain management, and lactic acid to be drawn.  OBJECTIVE:  Vital Signs: Vitals:   11/21/20 2000 11/22/20 0040 11/22/20 0446 11/22/20 1259  BP: (!) 160/99 (!) 150/86 (!) 160/99 (!) 168/99  Pulse: 80 88 75 78  Resp: 19 18 18 20   Temp: 97.9 F (36.6 C) 98.8 F (37.1 C) 98.6 F (37 C)   TempSrc:  Oral Oral Oral  SpO2: 100% 100% 100% 100%  Weight: 54 kg     Height: 5\' 6"  (1.676 m)      Supplemental O2:  Room Air SpO2: 100 %  Filed Weights   11/21/20 2000  Weight: 54 kg     Intake/Output Summary (Last 24 hours) at 11/22/2020 1545 Last data filed at 11/22/2020 0500 Gross per 24 hour  Intake 2456.42 ml  Output 300 ml  Net 2156.42 ml    Physical Exam: Physical Exam Vitals and nursing note reviewed.  Constitutional:      General: She is in acute distress.  HENT:     Head: Normocephalic and atraumatic.  Cardiovascular:     Rate and Rhythm: Normal rate and regular rhythm.     Heart sounds: Normal heart sounds. No murmur heard.   No friction rub. No gallop.  Pulmonary:     Effort: Pulmonary effort is normal. No respiratory distress.     Breath sounds: Normal breath sounds. No wheezing or rales.  Abdominal:     General: Abdomen is flat. Bowel sounds are increased. There is no distension.     Palpations: Abdomen is soft. There is no mass.      Tenderness: There is abdominal tenderness in the right lower quadrant and left lower quadrant.  Skin:    General: Skin is warm and dry.     Findings: No rash.  Neurological:     General: No focal deficit present.     Mental Status: She is alert and oriented to person, place, and time.    Pertinent Labs: CBC Latest Ref Rng & Units 11/22/2020 11/21/2020 10/08/2020  WBC 4.0 - 10.5 K/uL 14.3(H) 13.7(H) 4.3  Hemoglobin 12.0 - 15.0 g/dL 14.3 15.0 12.9  Hematocrit 36.0 - 46.0 % 42.4 46.2(H) 39.5  Platelets 150 - 400 K/uL 280 353 193    CMP Latest Ref Rng & Units 11/22/2020 11/21/2020 10/08/2020  Glucose 70 - 99 mg/dL 108(H) 137(H) 144(H)  BUN 8 - 23 mg/dL 10 19 15   Creatinine 0.44 - 1.00 mg/dL 0.66 0.93 0.85  Sodium 135 - 145 mmol/L 126(L) 132(L) 139  Potassium 3.5 - 5.1 mmol/L 3.7 3.7 3.9  Chloride 98 - 111 mmol/L 93(L) 95(L) 101  CO2 22 - 32 mmol/L 25 27 29   Calcium 8.9 - 10.3 mg/dL 7.8(L) 8.6(L) 9.6  Total Protein 6.5 - 8.1 g/dL - 6.1(L) 6.4(L)  Total Bilirubin 0.3 - 1.2 mg/dL - 0.6 0.5  Alkaline  Phos 38 - 126 U/L - 65 51  AST 15 - 41 U/L - 44(H) 60(H)  ALT 0 - 44 U/L - 57(H) 37    No results for input(s): GLUCAP in the last 72 hours.   Pertinent Imaging: No results found.  ASSESSMENT/PLAN:  Assessment: Principal Problem:   Acute colitis  Jacqueline Macias is a 65 y.o. female, with a PMHx of chronic constipation, who presented to [facility] (11/21/2020) with 2 days of abdominal pain and bloody stool, then admitted for care of acute colitis. HD 0  Plan: #Acute colitis of ascending and transverse colon Evident on CT. Fecal occult positive.  Likely viral in etiology. Colonoscopy last month, was negative for bleeding, inflammation, polyps, diverticulosis. Continues to be afebrile, VSS, although mildly hypertensive. Hemoglobin WNL and leukocytosis still present x2 days.  C. difficile negative.  GI panel pending.  Family requests a few extra work-ups (lactic acid,) and empiric  antibiotics.  We will provide supportive care with IVF, clear liquid diet, and symptomatic control.  Follow-up GI panel Follow-up lactic acid for ischemic colitis Restart IV Rocephin and metronidazole IVF, as seen below Repeat CBC in AM  #Acute asymptomatic hypoosmotic hyponatremia Likely GI loss, 2/2 diarrhea x3 days.  IV LR 125cc/h for 12h Repeat BMP in AM   Chronic mild transaminitis Has been stable for years. We will continue to monitor.   Best Practice: Diet:  Clear liquid IVF: LR,125cc/hr VTE: SCDs Start: 11/21/20 1749  Code: Full Code  PT/OT recs: None, none. TOC recs: Pending  Prior to Admission Living Arrangement: Home  Anticipated Discharge Location: Home Barriers to Discharge: Tolerating PO, hyponatremia  Dispo: Anticipated discharge in 1-2 days.  Signature: Merrily Brittle, DO Psychiatry Resident, PGY-1 [facility] Teaching Service Pager: [phone] 3:45 PM, 11/22/2020  Please contact the on call pager after 5 pm and on weekends at [phone].

## 2020-11-23 DIAGNOSIS — E871 Hypo-osmolality and hyponatremia: Secondary | ICD-10-CM | POA: Diagnosis present

## 2020-11-23 DIAGNOSIS — K921 Melena: Secondary | ICD-10-CM | POA: Diagnosis present

## 2020-11-23 DIAGNOSIS — R630 Anorexia: Secondary | ICD-10-CM | POA: Diagnosis present

## 2020-11-23 DIAGNOSIS — K529 Noninfective gastroenteritis and colitis, unspecified: Secondary | ICD-10-CM | POA: Diagnosis present

## 2020-11-23 DIAGNOSIS — K559 Vascular disorder of intestine, unspecified: Secondary | ICD-10-CM | POA: Diagnosis present

## 2020-11-23 DIAGNOSIS — Z86010 Personal history of colonic polyps: Secondary | ICD-10-CM | POA: Diagnosis not present

## 2020-11-23 DIAGNOSIS — Z7983 Long term (current) use of bisphosphonates: Secondary | ICD-10-CM | POA: Diagnosis not present

## 2020-11-23 DIAGNOSIS — Z20822 Contact with and (suspected) exposure to covid-19: Secondary | ICD-10-CM | POA: Diagnosis present

## 2020-11-23 DIAGNOSIS — Z87891 Personal history of nicotine dependence: Secondary | ICD-10-CM | POA: Diagnosis not present

## 2020-11-23 DIAGNOSIS — Z681 Body mass index (BMI) 19 or less, adult: Secondary | ICD-10-CM | POA: Diagnosis not present

## 2020-11-23 DIAGNOSIS — Z79899 Other long term (current) drug therapy: Secondary | ICD-10-CM | POA: Diagnosis not present

## 2020-11-23 LAB — CBC WITH DIFFERENTIAL/PLATELET
Abs Immature Granulocytes: 0.04 10*3/uL (ref 0.00–0.07)
Abs Immature Granulocytes: 0.04 10*3/uL (ref 0.00–0.07)
Basophils Absolute: 0 10*3/uL (ref 0.0–0.1)
Basophils Absolute: 0 10*3/uL (ref 0.0–0.1)
Basophils Relative: 0 %
Basophils Relative: 0 %
Eosinophils Absolute: 0.1 10*3/uL (ref 0.0–0.5)
Eosinophils Absolute: 0.1 10*3/uL (ref 0.0–0.5)
Eosinophils Relative: 1 %
Eosinophils Relative: 1 %
HCT: 37.4 % (ref 36.0–46.0)
HCT: 36.3 % (ref 36.0–46.0)
Hemoglobin: 12.7 g/dL (ref 12.0–15.0)
Hemoglobin: 12 g/dL (ref 12.0–15.0)
Immature Granulocytes: 0 %
Immature Granulocytes: 1 %
Lymphocytes Relative: 11 %
Lymphocytes Relative: 13 %
Lymphs Abs: 1.2 10*3/uL (ref 0.7–4.0)
Lymphs Abs: 1 10*3/uL (ref 0.7–4.0)
MCH: 30.3 pg (ref 26.0–34.0)
MCH: 30.3 pg (ref 26.0–34.0)
MCHC: 34 g/dL (ref 30.0–36.0)
MCHC: 33.1 g/dL (ref 30.0–36.0)
MCV: 89.3 fL (ref 80.0–100.0)
MCV: 91.7 fL (ref 80.0–100.0)
Monocytes Absolute: 0.9 10*3/uL (ref 0.1–1.0)
Monocytes Absolute: 0.7 10*3/uL (ref 0.1–1.0)
Monocytes Relative: 9 %
Monocytes Relative: 9 %
Neutro Abs: 8.2 10*3/uL — ABNORMAL HIGH (ref 1.7–7.7)
Neutro Abs: 5.8 10*3/uL (ref 1.7–7.7)
Neutrophils Relative %: 79 %
Neutrophils Relative %: 76 %
Platelets: 238 10*3/uL (ref 150–400)
Platelets: 268 10*3/uL (ref 150–400)
RBC: 4.19 MIL/uL (ref 3.87–5.11)
RBC: 3.96 MIL/uL (ref 3.87–5.11)
RDW: 12.3 % (ref 11.5–15.5)
RDW: 12.7 % (ref 11.5–15.5)
WBC: 10.4 10*3/uL (ref 4.0–10.5)
WBC: 7.5 10*3/uL (ref 4.0–10.5)
nRBC: 0 % (ref 0.0–0.2)
nRBC: 0 % (ref 0.0–0.2)

## 2020-11-23 LAB — COMPREHENSIVE METABOLIC PANEL
ALT: 37 U/L (ref 0–44)
AST: 28 U/L (ref 15–41)
Albumin: 2.2 g/dL — ABNORMAL LOW (ref 3.5–5.0)
Alkaline Phosphatase: 45 U/L (ref 38–126)
Anion gap: 5 (ref 5–15)
BUN: 8 mg/dL (ref 8–23)
CO2: 30 mmol/L (ref 22–32)
Calcium: 7.8 mg/dL — ABNORMAL LOW (ref 8.9–10.3)
Chloride: 97 mmol/L — ABNORMAL LOW (ref 98–111)
Creatinine, Ser: 0.7 mg/dL (ref 0.44–1.00)
GFR, Estimated: >60 mL/min (ref >60)
Glucose, Bld: 101 mg/dL — ABNORMAL HIGH (ref 70–99)
Potassium: 3.8 mmol/L (ref 3.5–5.1)
Sodium: 132 mmol/L — ABNORMAL LOW (ref 135–145)
Total Bilirubin: 0.6 mg/dL (ref 0.3–1.2)
Total Protein: 4.3 g/dL — ABNORMAL LOW (ref 6.5–8.1)

## 2020-11-23 MED ORDER — ALUM & MAG HYDROXIDE-SIMETH 200-200-20 MG/5ML PO SUSP
30.0000 mL | Freq: Four times a day (QID) | ORAL | Status: DC | PRN
  Administered 2020-11-23: 30 mL via ORAL
  Filled 2020-11-23: qty 30

## 2020-11-23 MED ORDER — ACETAMINOPHEN 325 MG PO TABS: 650.0000 mg | ORAL_TABLET | Freq: Four times a day (QID) | ORAL | Status: DC | PRN

## 2020-11-23 MED ORDER — ALPRAZOLAM 0.5 MG PO TABS
0.5000 mg | ORAL_TABLET | Freq: Every evening | ORAL | Status: DC | PRN
  Administered 2020-11-23: 0.5 mg via ORAL
  Filled 2020-11-23: qty 1

## 2020-11-23 NOTE — Progress Notes (Signed)
HD 2 SUBJECTIVE:  Patient Summary: Jacqueline Macias is a 65 y.o. female, with a PMHx of chronic constipation, who presented to [facility] (11/21/2020) with 2 days of abdominal pain and bloody stool.  Overnight Events: Projectile vomiting x1  Interim History: [name] was seen this AM with husband at bedside and son on phone.  States that she feels much better this morning and that she was able to sleep last night. States that she is now feeling hungry, and would like to attempt to progress her diet.  Also states that she is able to pass gas.  She denies abdominal pain, bowel movements overnight, bloating, nausea or vomiting, chest pain, shortness of breath, headache or dizziness.  Discussed with patient, that we need to trend platelets and kidney function to watch for the risk of HUS.  States that once these are stable, patient to be discharged.  OBJECTIVE:  Vital Signs: Vitals:   11/22/20 1616 11/22/20 2023 11/23/20 0545 11/23/20 0808  BP: (!) 157/98 132/76 130/81 132/83  Pulse: 78 69 78 67  Resp: 19 18 18 16   Temp: 97.6 F (36.4 C) 97.6 F (36.4 C) 98.4 F (36.9 C) 98 F (36.7 C)  TempSrc: Oral Oral Oral Oral  SpO2: 98% 99% 98% 100%  Weight:      Height:       Supplemental O2:  Room Air SpO2: 100 %  Filed Weights   11/21/20 2000  Weight: 54 kg    Intake/Output Summary (Last 24 hours) at 11/23/2020 1532 Last data filed at 11/23/2020 0500 Gross per 24 hour  Intake 1504.84 ml  Output --  Net 1504.84 ml    Physical Exam: Physical Exam Vitals and nursing note reviewed.  Constitutional:      General: She is not in acute distress. HENT:     Head: Normocephalic and atraumatic.  Cardiovascular:     Rate and Rhythm: Normal rate and regular rhythm.     Heart sounds: Normal heart sounds. No murmur heard.   No friction rub. No gallop.  Pulmonary:     Effort: Pulmonary effort is normal. No respiratory distress.     Breath sounds: Normal breath sounds. No  wheezing or rales.  Abdominal:     General: Abdomen is flat. Bowel sounds are normal. There is no distension.     Palpations: Abdomen is soft. There is no mass.     Tenderness: There is no abdominal tenderness.  Musculoskeletal:     Right lower leg: No edema.     Left lower leg: No edema.  Skin:    General: Skin is warm and dry.     Findings: No rash.  Neurological:     General: No focal deficit present.     Mental Status: She is alert and oriented to person, place, and time.    Pertinent Labs: CBC Latest Ref Rng & Units 11/23/2020 11/22/2020 11/21/2020  WBC 4.0 - 10.5 K/uL 10.4 14.3(H) 13.7(H)  Hemoglobin 12.0 - 15.0 g/dL 12.7 14.3 15.0  Hematocrit 36.0 - 46.0 % 37.4 42.4 46.2(H)  Platelets 150 - 400 K/uL 238 280 353    CMP Latest Ref Rng & Units 11/23/2020 11/22/2020 11/21/2020  Glucose 70 - 99 mg/dL 101(H) 108(H) 137(H)  BUN 8 - 23 mg/dL 8 10 19   Creatinine 0.44 - 1.00 mg/dL 0.70 0.66 0.93  Sodium 135 - 145 mmol/L 132(L) 126(L) 132(L)  Potassium 3.5 - 5.1 mmol/L 3.8 3.7 3.7  Chloride 98 - 111 mmol/L 97(L) 93(L) 95(L)  CO2 22 - 32 mmol/L 30 25 27   Calcium 8.9 - 10.3 mg/dL 7.8(L) 7.8(L) 8.6(L)  Total Protein 6.5 - 8.1 g/dL 4.3(L) - 6.1(L)  Total Bilirubin 0.3 - 1.2 mg/dL 0.6 - 0.6  Alkaline Phos 38 - 126 U/L 45 - 65  AST 15 - 41 U/L 28 - 44(H)  ALT 0 - 44 U/L 37 - 57(H)    No results for input(s): GLUCAP in the last 72 hours.   Pertinent Imaging: DG Abd 1 View  Result Date: 11/22/2020 CLINICAL DATA:  Vomiting EXAM: ABDOMEN - 1 VIEW COMPARISON:  11/30/2014 FINDINGS: Mildly prominent central small bowel loops. Gas seen within nondistended large bowel. Calcified phleboliths in the left pelvis. No organomegaly or free air. IMPRESSION: Mild gaseous distention of central small bowel loops, nonspecific. This could reflect focal ileus or less likely early small bowel obstruction. Electronically Signed   By: Rolm Baptise M.D.   On: 11/22/2020 16:58    ASSESSMENT/PLAN:   Assessment: Principal Problem:   Acute colitis  Jacqueline Macias is a 65 y.o. female, with a PMHx of chronic constipation, who presented to [facility] (11/21/2020) with 2 days of abdominal pain and bloody stool, then admitted for care of acute colitis 2/2 STEC. HD 2  Plan: #Acute colitis 2/2 STEC.  We will treat supportively with aggressive IVF and pain management. Will monitor and trend PLT (<150), HCT (<30%), Cr for any progression to HUS. Discussed with family contact precautions. Will avoid antibiotics, meds with anti-mobility effects such as opioids, and any that impedes renal flow such as NSAIDs. LR IV 125cc/h CBC twice daily to trend HCT and PLTs Dilaudid 0.5 mg IV PRN for pain  #Acute asymptomatic hypoosmotic hyponatremia 2/2 GI loss from diarrhea x3 days. Improved since yesterday, patient is also having diarrhea. Expect Na to continue to improve. IVF as above Repeat BMP in AM   Chronic mild transaminitis Has been stable for years. We will continue to monitor.   Best Practice: Diet:  Clear liquid Pain: Dilaudid 0.5mg  IV IVF: LR,125cc/hr VTE: SCDs Start: 11/21/20 1749  Code: Full Code  PT/OT recs: None, none. TOC recs: Pending  Prior to Admission Living Arrangement: Home  Anticipated Discharge Location: Home Barriers to Discharge: Downtrending PLT and HCT, risk of HUS  Dispo: Anticipated discharge in 1-2 days.  Signature: Merrily Brittle, DO Psychiatry Resident, PGY-1 [facility] Teaching Service Pager: [phone] 3:32 PM, 11/23/2020  Please contact the on call pager after 5 pm and on weekends at [phone].

## 2020-11-24 ENCOUNTER — Encounter (HOSPITAL_COMMUNITY): Payer: Self-pay | Admitting: Internal Medicine

## 2020-11-24 DIAGNOSIS — R7401 Elevation of levels of liver transaminase levels: Secondary | ICD-10-CM | POA: Insufficient documentation

## 2020-11-24 HISTORY — DX: Elevation of levels of liver transaminase levels: R74.01

## 2020-11-24 LAB — BASIC METABOLIC PANEL
Anion gap: 4 — ABNORMAL LOW (ref 5–15)
BUN: <5 mg/dL — ABNORMAL LOW (ref 8–23)
CO2: 29 mmol/L (ref 22–32)
Calcium: 7.8 mg/dL — ABNORMAL LOW (ref 8.9–10.3)
Chloride: 106 mmol/L (ref 98–111)
Creatinine, Ser: 0.61 mg/dL (ref 0.44–1.00)
GFR, Estimated: >60 mL/min (ref >60)
Glucose, Bld: 90 mg/dL (ref 70–99)
Potassium: 3 mmol/L — ABNORMAL LOW (ref 3.5–5.1)
Sodium: 139 mmol/L (ref 135–145)

## 2020-11-24 LAB — CBC WITH DIFFERENTIAL/PLATELET
Abs Immature Granulocytes: 0.02 10*3/uL (ref 0.00–0.07)
Basophils Absolute: 0 10*3/uL (ref 0.0–0.1)
Basophils Relative: 1 %
Eosinophils Absolute: 0.1 10*3/uL (ref 0.0–0.5)
Eosinophils Relative: 2 %
HCT: 33.1 % — ABNORMAL LOW (ref 36.0–46.0)
Hemoglobin: 11.1 g/dL — ABNORMAL LOW (ref 12.0–15.0)
Immature Granulocytes: 0 %
Lymphocytes Relative: 20 %
Lymphs Abs: 1 10*3/uL (ref 0.7–4.0)
MCH: 30.2 pg (ref 26.0–34.0)
MCHC: 33.5 g/dL (ref 30.0–36.0)
MCV: 90.2 fL (ref 80.0–100.0)
Monocytes Absolute: 0.5 10*3/uL (ref 0.1–1.0)
Monocytes Relative: 10 %
Neutro Abs: 3.4 10*3/uL (ref 1.7–7.7)
Neutrophils Relative %: 67 %
Platelets: 221 10*3/uL (ref 150–400)
RBC: 3.67 MIL/uL — ABNORMAL LOW (ref 3.87–5.11)
RDW: 12.7 % (ref 11.5–15.5)
WBC: 5.1 10*3/uL (ref 4.0–10.5)
nRBC: 0 % (ref 0.0–0.2)

## 2020-11-24 MED ORDER — POTASSIUM CHLORIDE CRYS ER 20 MEQ PO TBCR
40.0000 meq | EXTENDED_RELEASE_TABLET | Freq: Four times a day (QID) | ORAL | Status: AC
  Administered 2020-11-24 (×2): 40 meq via ORAL
  Filled 2020-11-24 (×2): qty 2

## 2020-11-24 MED ORDER — MELATONIN 3 MG PO TABS
3.0000 mg | ORAL_TABLET | Freq: Every day | ORAL | Status: DC
  Administered 2020-11-24: 3 mg via ORAL
  Filled 2020-11-24: qty 1

## 2020-11-24 NOTE — Discharge Summary (Signed)
Name: Jacqueline Macias   MRN: [id number] DOB: 02/03/1956 65 y.o. PCP: Barbette Or, MD  Date of Admission: 11/21/2020  8:20 AM Date of Discharge: 11/24/2020 Attending Physician: Dorian Pod, MD  Discharge Diagnosis: Acute colitis 2/2 STEC Hx of elevated transaminase level  Principal Problem:   Acute colitis  Discharge Medications: Allergies as of 11/24/2020   No Known Allergies      Medication List     TAKE these medications    alendronate 70 MG tablet Commonly known as: FOSAMAX Take 70 mg by mouth once a week.   ALPRAZolam 0.5 MG tablet Commonly known as: XANAX Take 0.5 mg by mouth at bedtime as needed for anxiety or sleep.   fluticasone 50 MCG/ACT nasal spray Commonly known as: FLONASE Place 2 sprays into both nostrils daily. What changed:  when to take this reasons to take this   nitroGLYCERIN 0.2 mg/hr patch Commonly known as: NITRODUR - Dosed in mg/24 hr USE 1/4 PATCH TO THE AFFECTED AREA DAILY AS DIRECTED        Disposition and follow-up:   Ms.Jacqueline Macias was discharged from [facility] in Good condition. At the hospital follow up visit please address:  Acute Colitis 2/2 STEC. Trend platelets, hematocrit, creatinine Labs / imaging needed at time of follow-up: CBC, CMP Pending labs/ test needing follow-up: None  Follow-up Appointments:  Follow-up Information     Barbette Or, MD Follow up on 11/26/2020.   Specialty: Family Medicine Contact information: [address] [place]  [address] [phone]                Hospital Course by problem list: Ms. Jacqueline Macias is a 65 y.o. female, with no pertinent PMHx, presents to [facility] (11/21/2020) with abdominal pain and bloody stool for about 2 days. LOS: 3 days  In the ED, Patient was noted to be normotensive at 133/97, tachycardic at 108. Afebrile at 97.9.  Saturating at 100% on RA.   Initial blood work remarkable for  leukocytosis, mild hyponatremia, elevated AST/ALT and positive fecal occult.  She was started on empiric IV antibiotics ceftriaxone and metronidazole, also received IVF.   During hospitalization, GI panel was positive for STEC so antibiotics were discontinued. She was treated conservatively with aggressive IVF.treated hematocrit and platelets twice daily. Treated electrolytes and creatinine daily. Clinically improved within 2 days. Was found to be hyponatremic, that resolved with fluid. Had 1 event of projectile vomiting, that self resolved.  Did order abdominal x-ray, which showed possible focal ileus or early SBO.  However due to self resolution, no other interventions needed.   Consults: None  Plan:  #Acute colitis of ascending and transverse colon 2/2 STEC. Evident on CT. Fecal occult positive. Initially presumed to be viral in etiology, because colonoscopy last month, was negative for bleeding, inflammation, polyps, diverticulosis.  She presented mildly tachycardic, likely due to dehydration from diarrhea, otherwise afebrile and VSS. Hemoglobin WNL, leukocytosis present. C. difficile ruled out. GI panel was positive for STEC.  Monitored and trended PLT (<150), HCT (<30%), Cr for any progression to HUS. Discussed with family contact precautions.  Avoided antibiotics, meds with anti-mobility effects such as opioids, and any that impedes renal flow such as NSAIDs. Treated with supportive care with IVF, clear liquid diet, and symptomatic control. Continued IV LR 125cc/h Trended hematocrit, platelets Monitored Cr   #Acute asymptomatic hypoosmotic hyponatremia, RESOLVED 2/2 GI loss from diarrhea x3 days. Improved since yesterday, patient is also having diarrhea.  Expect Na to continue to improve. IVF as above Repeat BMP in AM  #Chronic mild transaminitis Has been stable for years. We will continue to monitor.   Discharge Exam:   BP 120/67 (BP Location: Right Arm)   Pulse 72   Temp 97.8 F  (36.6 C) (Oral)   Resp 17   Ht 5\' 6"  (1.676 m)   Wt 54 kg   SpO2 100%   BMI 19.21 kg/m   Physical Exam Vitals and nursing note reviewed.  HENT:     Head: Normocephalic and atraumatic.  Cardiovascular:     Rate and Rhythm: Normal rate and regular rhythm.  Pulmonary:     Effort: Pulmonary effort is normal.     Breath sounds: Normal breath sounds.  Abdominal:     General: Abdomen is flat. Bowel sounds are normal. There is no distension.     Palpations: Abdomen is soft.     Tenderness: There is no abdominal tenderness. There is no guarding or rebound.  Skin:    General: Skin is warm.  Neurological:     General: No focal deficit present.     Mental Status: She is alert and oriented to person, place, and time.    Pertinent Labs, Studies, and Procedures:  CBC Latest Ref Rng & Units 11/24/2020 11/23/2020 11/23/2020  WBC 4.0 - 10.5 K/uL 5.1 7.5 10.4  Hemoglobin 12.0 - 15.0 g/dL 11.1(L) 12.0 12.7  Hematocrit 36.0 - 46.0 % 33.1(L) 36.3 37.4  Platelets 150 - 400 K/uL 221 268 238   Lab Results  Component Value Date   CREATININE 0.61 11/24/2020      CT ABDOMEN PELVIS WO CONTRAST  Result Date: 11/21/2020 CLINICAL DATA:  Generalized abdomen pain for 3 days with nausea, vomiting and diarrhea. EXAM: CT ABDOMEN AND PELVIS WITHOUT CONTRAST TECHNIQUE: Multidetector CT imaging of the abdomen and pelvis was performed following the standard protocol without IV contrast. COMPARISON:  December 05, 2014 FINDINGS: Lower chest: No acute abnormality. Hepatobiliary: There is a low-density mass in the posterior liver unchanged compared to prior CT. There is a liver cyst near the gallbladder fossa unchanged. The gallbladder is distended. The biliary tree is unremarkable. Pancreas: Unremarkable. No pancreatic ductal dilatation or surrounding inflammatory changes. Spleen: Normal in size without focal abnormality. Adrenals/Urinary Tract: Adrenal glands are unremarkable. Kidneys are normal, without renal  calculi, focal lesion, or hydronephrosis. Bladder is unremarkable. Stomach/Bowel: The colon is collapsed with no oral contrast; however, there is suggestion of diffuse bowel wall thickening of the transverse colon and the ascending colon with surrounding inflammation and fluid of the ascending colon. The small bowel is normal without evidence of small bowel obstruction. The stomach is unremarkable. The appendix is not seen. Vascular/Lymphatic: No significant vascular findings are present. No enlarged abdominal or pelvic lymph nodes. Reproductive: Uterus and bilateral adnexa are unremarkable. Other: Ascites is identified in the abdomen and pelvis. Musculoskeletal: Degenerative joint changes of thoracic spine are noted. IMPRESSION: 1. The colon is collapsed with no oral contrast; however, there is suggestion of diffuse bowel wall thickening of the transverse colon and the ascending colon with surrounding inflammation and fluid of the ascending colon. The findings are nonspecific but can be seen in colitis. 2. Ascites in the abdomen and pelvis. Electronically Signed   By: Abelardo Diesel M.D.   On: 11/21/2020 11:15   DG Abd 1 View  Result Date: 11/22/2020 CLINICAL DATA:  Vomiting EXAM: ABDOMEN - 1 VIEW COMPARISON:  11/30/2014 FINDINGS: Mildly prominent central small bowel loops.  Gas seen within nondistended large bowel. Calcified phleboliths in the left pelvis. No organomegaly or free air. IMPRESSION: Mild gaseous distention of central small bowel loops, nonspecific. This could reflect focal ileus or less likely early small bowel obstruction. Electronically Signed   By: Rolm Baptise M.D.   On: 11/22/2020 16:58     Discharge Instructions: Dear Ms. Jacqueline Macias,   I am so glad you are feeling better! You were admitted to the hospital for Acute Colitis (infection of the large bowel) that was due to Shiga-like toxin producing E. Coli (also called STEC).   Please see the following instructions:   For your:  1. Please  see your primary care / family doctor on Thursday (10/13) or Friday (10/14) for a [facility].  a. There you will get blood lab work: CBC and CMP  b. This blood work will be important to trend your platelets, blood counts and kidney function.  2. If you see any of these signs or symptoms, please go to the hospital:  a. Decreased urine, although you are drinking plenty of water  b. Your urine color becoming dark brown or the color of Coca-[employer]a  3. Please drink plenty of water to keep your kidneys hydrated.  There are no physical restrictions, please continue activity as you wish.  4. Please avoid medicines such as, Aleve, ibuprofen, aspirin, Motrin, meloxicam (drug class NSAIDs) until your loose stools resolve.  5. Tylenol is okay to take for pain control. I recommend 650 mg every 6 hours or 1000 mg every 8 hours.  6. Please continue to take all the other medicines as you are prior   It was a pleasure meeting you, Ms. Jacqueline Macias.  I wish you and your family the best, and hope you stay happy and healthy!   Signed: Merrily Brittle, DO Psychiatry Resident, PGY-1 [facility] Teaching Service 11/24/2020, 6:02 PM   Pager: [phone]

## 2020-11-24 NOTE — Progress Notes (Signed)
Patient has ordered for discharge. Given discharge instructions with paper to the patient and family. Iv removed. Given all belongings to the patient and family.

## 2020-12-04 ENCOUNTER — Other Ambulatory Visit: Payer: Self-pay | Admitting: Obstetrics and Gynecology

## 2020-12-04 DIAGNOSIS — M810 Age-related osteoporosis without current pathological fracture: Secondary | ICD-10-CM

## 2020-12-29 ENCOUNTER — Ambulatory Visit (INDEPENDENT_AMBULATORY_CARE_PROVIDER_SITE_OTHER): Payer: 59 | Admitting: Nurse Practitioner

## 2020-12-29 ENCOUNTER — Encounter: Payer: Self-pay | Admitting: Nurse Practitioner

## 2020-12-29 VITALS — BP 110/64 | HR 69 | Ht 66.0 in | Wt 123.0 lb

## 2020-12-29 DIAGNOSIS — A0472 Enterocolitis due to Clostridium difficile, not specified as recurrent: Secondary | ICD-10-CM | POA: Diagnosis not present

## 2020-12-29 DIAGNOSIS — A498 Other bacterial infections of unspecified site: Secondary | ICD-10-CM

## 2020-12-29 NOTE — Progress Notes (Signed)
ASSESSMENT AND PLAN    #65 year old female hospitalized last month with severe bloody diarrhea / suggestion of colitis on non-con CTAP.  Stool pathogen panel positive for Shigella toxin producing E. coli .  Antibiotics were stopped once pathogen identified . Symptoms resolved with supportive care . Infection was NOT complicated by hemolytic uremic syndrome. Platelets and renal function remained normal. Mild decline in hgb from 12 to 11.1 later in admission after IV hydration.   # C-diff infection .  Unfortunately within a week or so after hospital discharge she developed recurrent diarrhea.  C. Diff + at PCP's office on 12/18/20.  --Diarrhea resolving on oral vancomycin and probiotics. --Patient will call us if she has any recurrent symptoms.   HISTORY OF PRESENT ILLNESS    Chief Complaint : Recent hospitalization for colitis with subsequent C. difficile infection + at PCP's office on 11/4.   Jacqueline Macias is a 65 y.o. female known to Dr. Loletha Macias with a past medical history of colon polyps, C. difficile infection, Shigella toxin producing E. Coli, osteoporosis. Additional medical history as listed in [place] .   Patient was last seen at time of her polyp surveillance colonoscopy September 2022  She comes in today after being hospitalized last month with severe. Non-contrast CT scan suggested diffuse bowel wall thickening in the transverse colon and the ascending colon  and also ascites.  We did not see her during that admission.Jacqueline Macias  Hospital discharge summary reviewed.  She was started on Rocephin and Flagyl pending stool studies.   C. difficile negative.  Stool pathogen panel positive for Shigella toxin producing E. Coli.  Antibiotics discontinued.  She improved with supportive care   Jacqueline Macias developed recurrent diarrhea a week after discharge and on 12/18/2020 she tested positive for C-diff at PCP's office. She was prescribed 10 days of vancomycin . She has a few days left of treatment,  also taking a probiotic. Diarrhea is resolving. Having two to two solid BMs a day. No fevers. No nausea / vomiting.      Ref Range & Units 11 d ago Comments  C Difficile Toxins A+B, EIA Negative Positive       Current Medications, Allergies, Past Medical History, Past Surgical History, Family History and Social History were reviewed in [employer] record.     Current Outpatient Medications  Medication Sig Dispense Refill   alendronate (FOSAMAX) 70 MG tablet Take 70 mg by mouth once a week.     ALPRAZolam (XANAX) 0.5 MG tablet Take 0.5 mg by mouth at bedtime as needed for anxiety or sleep.     fluticasone (FLONASE) 50 MCG/ACT nasal spray Place 2 sprays into both nostrils daily. (Patient taking differently: Place 2 sprays into both nostrils daily as needed for allergies.) 16 g 0   No current facility-administered medications for this visit.    Review of Systems: No chest pain. No shortness of breath. No urinary complaints.   PHYSICAL EXAM :    Wt Readings from Last 3 Encounters:  12/29/20 123 lb (55.8 kg)  11/21/20 119 lb 0.8 oz (54 kg)  10/22/20 120 lb (54.4 kg)    BP 110/64   Pulse 69   Ht 5\' 6"  (1.676 m)   Wt 123 lb (55.8 kg)   BMI 19.85 kg/m  Constitutional:  Generally well appearing thin female in no acute distress. Psychiatric: Pleasant. Normal mood and affect. Behavior is normal. EENT: Pupils normal.  Conjunctivae are normal. No scleral icterus. Neck supple.  Cardiovascular: Normal rate, regular rhythm. No edema Pulmonary/chest: Effort normal and breath sounds normal. No wheezing, rales or rhonchi. Abdominal: Soft, nondistended, nontender. Bowel sounds active throughout. There are no masses palpable. No hepatomegaly. Neurological: Alert and oriented to person place and time. Skin: Skin is warm and dry. No rashes noted.  Jacqueline Savoy, NP  12/29/2020, 3:20 PM

## 2020-12-29 NOTE — Patient Instructions (Signed)
If you are age 65 or younger, your body mass index should be between 19-25. Your Body mass index is 19.85 kg/m. If this is out of the aformentioned range listed, please consider follow up with your Primary Care Provider.  ________________________________________________________  The [facility] providers would like to encourage you to use [facility] to communicate with providers for non-urgent requests or questions.  Due to long hold times on the telephone, sending your provider a message by [facility] may be a faster and more efficient way to get a response.  Please allow 48 business hours for a response.  Please remember that this is for non-urgent requests.  _______________________________________________________  Follow up as needed.  Thank you for entrusting me with your care and choosing [facility].  Tye Savoy, NP-C

## 2020-12-31 ENCOUNTER — Encounter: Payer: Self-pay | Admitting: Gastroenterology

## 2021-01-05 NOTE — Progress Notes (Signed)
____________________________________________________________  Attending physician addendum:  Thank you for sending this case to me, and for discussing it during the clinic visit. I have reviewed the entire note and agree with the plan.  Also, I reviewed the CT scan images and there is ascites in the pelvis, most likely due to the severity of the colitis.  That has likely resolved as the patient has recovered.  Wilfrid Lund, MD  ____________________________________________________________

## 2021-03-18 ENCOUNTER — Ambulatory Visit (INDEPENDENT_AMBULATORY_CARE_PROVIDER_SITE_OTHER): Payer: 59 | Admitting: Sports Medicine

## 2021-03-18 VITALS — BP 141/95 | Ht 66.0 in | Wt 125.0 lb

## 2021-03-18 DIAGNOSIS — M7632 Iliotibial band syndrome, left leg: Secondary | ICD-10-CM | POA: Insufficient documentation

## 2021-03-18 NOTE — Progress Notes (Signed)
° °  Jacqueline Macias is a 66 y.o. female who presents to [facility] today for the following:  Left IT Band Pain Started in September during her [employer] She biked, then when she completed, her knee collapsed inward She was able to finish, but only with powerwalking Since then she has not been running or biking She was trying to rest this She had a distal IT band injection under ultrasound guidance performed by Dr. Thedore Mins in November States that it helped somewhat Had an MRI as well that did not show any meniscal pathology, there was evidence of IT band syndrome on this Started swimming recently but has been having some pain and tightness throughout the IT band When she goes on walks with her husband she feels the pain distally, but also feels tightness along her IT band Is been getting deep tissue massage States that she really rested over the last week and feels like her tightness is improving somewhat She is wanting assistance with improving this   PMH reviewed.  ROS as above. Medications reviewed.  Exam:  BP (!) 141/95    Ht 5\' 6"  (1.676 m)    Wt 125 lb (56.7 kg)    BMI 20.18 kg/m  Gen: Well NAD MSK:  Left IT band: - Inspection: No gross deformity, no swelling, erythema, or ecchymosis b/l - Palpation: She has some tenderness to deep palpation of her left IT band, otherwise no tenderness to palpation - ROM: Normal range of motion on Flexion, extension, abduction, internal and external rotation b/l of her hips - Strength: Normal strength in all fields b/l - Neuro/vasc: NV intact distally b/l - Special Tests: She has tightness in the IT band on left, negative Noble's compression  Left foot has some mild resting pronation On gait analysis she has some pronation with forefoot varus on left   No results found.   Assessment and Plan: 1) It band syndrome, left This is a patient trialing shockwave therapy and letting us know in about 48 hours if she finds this helpful.  We  can get her scheduled for another treatment next week and continue with this.  Also given eccentric exercises for IT band and stretching.  She will follow-up with [place] in about a week if she finds shockwave helpful.  Procedure: ECSWT Indications:  left IT band syndrome   Procedure Details Consent: Risks of procedure as well as the alternatives and risks of each were explained to the patient.  Written consent for procedure obtained. Time Out: Verified patient identification, verified procedure, site was marked, verified correct patient position, medications/allergies/relevent history reviewed.  The area was cleaned with alcohol swab.     The left IT band was targeted for Extracorporeal shockwave therapy.    Preset: trochanteric bursitis Power Level: 120   Frequency: 10 Impulse/cycles: 1500 Head size: large   Patient tolerated procedure well without immediate complications     [place] Constable, D.O.  PGY-4 [facility]  03/18/2021 5:18 PM  I observed and examined the patient with the [place] resident and agree with assessment and plan.  Note reviewed and modified by me. In addition to above we modified her sports insole on left with scahpoid pad and first ray post which improved her gait and lessened the dynamic genu valgus.  She needs to be sure the knee is not going into valgus on bike or else move cleat slightly lateral.  Ila Mcgill, MD

## 2021-03-18 NOTE — Assessment & Plan Note (Addendum)
This is a patient trialing shockwave therapy and letting us know in about 48 hours if she finds this helpful.  We can get her scheduled for another treatment next week and continue with this.  Also given eccentric exercises for IT band and stretching.  She will follow-up with [place] in about a week if she finds shockwave helpful.  Procedure: ECSWT Indications:  left IT band syndrome   Procedure Details Consent: Risks of procedure as well as the alternatives and risks of each were explained to the patient.  Written consent for procedure obtained. Time Out: Verified patient identification, verified procedure, site was marked, verified correct patient position, medications/allergies/relevent history reviewed.  The area was cleaned with alcohol swab.     The left IT band was targeted for Extracorporeal shockwave therapy.    Preset: trochanteric bursitis Power Level: 120   Frequency: 10 Impulse/cycles: 1500 Head size: large   Patient tolerated procedure well without immediate complications

## 2021-03-24 ENCOUNTER — Ambulatory Visit (INDEPENDENT_AMBULATORY_CARE_PROVIDER_SITE_OTHER): Payer: Self-pay | Admitting: Family Medicine

## 2021-03-24 VITALS — Ht 65.5 in

## 2021-03-24 DIAGNOSIS — M7632 Iliotibial band syndrome, left leg: Secondary | ICD-10-CM

## 2021-03-24 NOTE — Progress Notes (Signed)
° °  Jacqueline Macias is a 66 y.o. female who presents to [facility] today for the following:  Left IT band syndrome Patient seen on 2/2 and had initial ECSW Feels some improvement but thinks she has too much arch support now  Procedure: ECSWT Indications:  Left IT band syndrome   Procedure Details Consent: Risks of procedure as well as the alternatives and risks of each were explained to the patient.  Written consent for procedure obtained. Time Out: Verified patient identification, verified procedure, site was marked, verified correct patient position, medications/allergies/relevent history reviewed.  The area was cleaned with alcohol swab.     The left IT band was targeted for Extracorporeal shockwave therapy.    Preset: trochanteric bursitis Power Level: 120 Frequency: 10 Impulse/cycles: 2000 Head size: large   Patient tolerated procedure well without immediate complications  She would like to do twice weekly treatments. Given smaller grey scaphoid pads to try.  [place] Constable, D.O.  PGY-4 [facility]  03/24/2021 8:12 AM  Addendum:  I was the preceptor for this visit and available for immediate consultation.  Karlton Lemon MD [facility]

## 2021-03-26 ENCOUNTER — Ambulatory Visit (INDEPENDENT_AMBULATORY_CARE_PROVIDER_SITE_OTHER): Payer: Self-pay | Admitting: Sports Medicine

## 2021-03-26 VITALS — Ht 65.5 in

## 2021-03-26 DIAGNOSIS — M7632 Iliotibial band syndrome, left leg: Secondary | ICD-10-CM

## 2021-03-26 NOTE — Progress Notes (Signed)
Subjective: Jacqueline Macias presents today for ECSWT #3 today. She continues to find improvement. Has not got back into running yet.  Procedure: ECSWT Indications:  Left IT band syndrome   Procedure Details Consent: Risks of procedure as well as the alternatives and risks of each were explained to the patient.  Written consent for procedure obtained. Time Out: Verified patient identification, verified procedure, site was marked, verified correct patient position, medications/allergies/relevent history reviewed.  The area was cleaned with alcohol swab.     The left IT band was targeted for Extracorporeal shockwave therapy.    Preset: trochanteric bursitis Power Level: 120 Frequency: 10 Impulse/cycles: 2000 Head size: large   Patient tolerated procedure well without immediate complications. Will continue bi-weekly to weekly treatments as desired.  Elba Barman, DO PGY-4, Sports Medicine Fellow [place]

## 2021-03-29 ENCOUNTER — Ambulatory Visit (INDEPENDENT_AMBULATORY_CARE_PROVIDER_SITE_OTHER): Payer: Self-pay | Admitting: Sports Medicine

## 2021-03-29 DIAGNOSIS — M7632 Iliotibial band syndrome, left leg: Secondary | ICD-10-CM

## 2021-03-30 NOTE — Progress Notes (Signed)
Patient ID: Jacqueline Macias, female   DOB: April 13, 1955, 66 y.o.   MRN: [id number]  Daine Floras presents today for ECSWT treatment #4 of left leg IT band syndrome.  Her last treatment was targeted more around the distal knee and this resulted in some increasing discomfort for her.  We decided today to focus proximal to this area along the mid to distal iliotibial band but avoiding the lateral femoral condyle.  She tolerates this without difficulty.  She is currently scheduled for biweekly treatments and she will follow-up as scheduled on February 15.  Procedure: ECSWT Indications:  Left IT band syndrome   Procedure Details Consent: Risks of procedure as well as the alternatives and risks of each were explained to the patient.  Written consent for procedure obtained. Time Out: Verified patient identification, verified procedure, site was marked, verified correct patient position, medications/allergies/relevent history reviewed.       The left IT band was targeted for Extracorporeal shockwave therapy.    Preset: trochanteric bursitis Power Level: 120 Frequency: 10 Impulse/cycles: 2000 Head size: large   Patient tolerated procedure well without immediate complications. Will continue bi-weekly to weekly treatments as desired.   This note was dictated using Dragon naturally speaking software and may contain errors in syntax, spelling, or content which have not been identified prior to signing this note.

## 2021-03-31 ENCOUNTER — Ambulatory Visit: Payer: 59 | Admitting: Family Medicine

## 2021-03-31 ENCOUNTER — Ambulatory Visit (INDEPENDENT_AMBULATORY_CARE_PROVIDER_SITE_OTHER): Payer: Self-pay | Admitting: Sports Medicine

## 2021-03-31 DIAGNOSIS — M7632 Iliotibial band syndrome, left leg: Secondary | ICD-10-CM

## 2021-03-31 NOTE — Progress Notes (Signed)
Subjective: Jacqueline Macias presents today for shockwave therapy session #5 today for the left IT band. Had some soreness after focusing on distal insertion two sessions ago, but felt better after this last one.  She does continue to see improvement and would like a few additional treatments.  Procedure: ECSWT Indications:  Left IT band syndrome   Procedure Details Consent: Risks of procedure as well as the alternatives and risks of each were explained to the patient.  Written consent for procedure obtained. Time Out: Verified patient identification, verified procedure, site was marked, verified correct patient position, medications/allergies/relevent history reviewed.       The left IT band was targeted for Extracorporeal shockwave therapy.    Preset: trochanteric bursitis Power Level: 120 Frequency: 10 Impulse/cycles: 2000 Head size: large   She tolerated procedure well without immediate complications. Will continue bi-weekly to weekly treatments as desired.  Jacqueline Barman, DO PGY-4, Sports Medicine Fellow [place]  Addendum:  I was the preceptor for this visit and available for immediate consultation.  Karlton Lemon MD [facility]

## 2021-04-02 ENCOUNTER — Ambulatory Visit: Payer: 59 | Admitting: Family Medicine

## 2021-04-05 ENCOUNTER — Ambulatory Visit (INDEPENDENT_AMBULATORY_CARE_PROVIDER_SITE_OTHER): Payer: Self-pay | Admitting: Family Medicine

## 2021-04-05 ENCOUNTER — Encounter: Payer: Self-pay | Admitting: Family Medicine

## 2021-04-05 DIAGNOSIS — M7632 Iliotibial band syndrome, left leg: Secondary | ICD-10-CM

## 2021-04-05 NOTE — Progress Notes (Signed)
Patient returns for 6th shockwave treatment for left IT band.  Overall still dealing with lateral distal thigh/knee pain.  She has difficulty walking 2 miles at a time outdoors.  Pain started after completed biking of full triathlon in the fall when she put her left foot down to start running.  Doing home exercises and stretches - still noted weakness at 5-/5 with hip abduction today, positive noble's test.  Procedure: ECSWT Indications:  left IT band syndrome   Procedure Details Consent: Risks of procedure as well as the alternatives and risks of each were explained to the patient.  Written consent for procedure obtained. Time Out: Verified patient identification, verified procedure, site was marked, verified correct patient position, medications/allergies/relevent history reviewed.  The area was cleaned with alcohol swab.     The left distal IT band was targeted for Extracorporeal shockwave therapy.    Preset: myofascial trigger points Power Level: 120 Frequency: 10 Impulse/cycles: 2000 Head size: large   Patient tolerated procedure well without immediate complications

## 2021-04-07 ENCOUNTER — Ambulatory Visit: Payer: 59 | Admitting: Family Medicine

## 2021-04-09 ENCOUNTER — Ambulatory Visit (INDEPENDENT_AMBULATORY_CARE_PROVIDER_SITE_OTHER): Payer: Self-pay | Admitting: Family Medicine

## 2021-04-09 ENCOUNTER — Other Ambulatory Visit: Payer: Self-pay

## 2021-04-09 DIAGNOSIS — M7632 Iliotibial band syndrome, left leg: Secondary | ICD-10-CM

## 2021-04-09 NOTE — Progress Notes (Signed)
I was available in real time for the Sports Medicine fellow as the preceptor for this visit.

## 2021-04-09 NOTE — Progress Notes (Signed)
° °  Jacqueline Macias is a 66 y.o. female who presents to [facility] today for the following:  Left IT band Syndrome Presents today for her 7th treatment, last on 2/20 Has noticed improvement and not had any pain in 3 days   Procedure: ECSWT Indications:  left IT band syndrome   Procedure Details Consent: Risks of procedure as well as the alternatives and risks of each were explained to the patient.  Written consent for procedure obtained. Time Out: Verified patient identification, verified procedure, site was marked, verified correct patient position, medications/allergies/relevent history reviewed.  The area was cleaned with alcohol swab.     The left IT band was targeted for Extracorporeal shockwave therapy.    Preset: trochanteric bursitis Power Level: 120 Frequency: 10 Impulse/cycles: 2000 Head size: large   Patient tolerated procedure well without immediate complications  She plans for two more treatments and then will see how she is feeling to continue or not   [place] Constable, D.O.  PGY-4 [facility]  04/09/2021 10:16 AM

## 2021-04-12 ENCOUNTER — Ambulatory Visit: Payer: 59 | Admitting: Family Medicine

## 2021-04-12 ENCOUNTER — Ambulatory Visit (INDEPENDENT_AMBULATORY_CARE_PROVIDER_SITE_OTHER): Payer: Self-pay | Admitting: Family Medicine

## 2021-04-12 ENCOUNTER — Encounter: Payer: Self-pay | Admitting: Family Medicine

## 2021-04-12 DIAGNOSIS — M7632 Iliotibial band syndrome, left leg: Secondary | ICD-10-CM

## 2021-04-12 NOTE — Progress Notes (Signed)
Patient reports she's doing well, noting improvement Able to walk up to 5 miles now and been using treadmill as well. Here for her 8th treatment for left IT band syndrome.  Procedure: ECSWT Indications:  left IT band syndrome   Procedure Details Consent: Risks of procedure as well as the alternatives and risks of each were explained to the patient.  Written consent for procedure obtained. Time Out: Verified patient identification, verified procedure, site was marked, verified correct patient position, medications/allergies/relevent history reviewed.  The area was cleaned with alcohol swab.     The left IT band was targeted for Extracorporeal shockwave therapy.    Preset: myofascial trigger points Power Level: 120 Frequency: 10 Impulse/cycles: 2000 Head size: large   Patient tolerated procedure well without immediate complications.   She has appointment for treatment Friday - given her level of improvement discussed this may be the last treatment, will see how she's doing then.

## 2021-04-16 ENCOUNTER — Ambulatory Visit (INDEPENDENT_AMBULATORY_CARE_PROVIDER_SITE_OTHER): Payer: Self-pay | Admitting: Family Medicine

## 2021-04-16 VITALS — Ht 65.5 in

## 2021-04-16 DIAGNOSIS — M7632 Iliotibial band syndrome, left leg: Secondary | ICD-10-CM

## 2021-04-16 NOTE — Progress Notes (Signed)
? ?  Jacqueline Macias is a 66 y.o. female who presents to [facility] today for the following: ? ?Left IT band Syndrome ?Presents today for 9th ECSW treatment ?Has been walking 45 min on treadmill daily without difficulty ?Happy with her progress ? ?Procedure: ECSWT ?Indications:  left IT band syndrome ?  ?Procedure Details ?Consent: Risks of procedure as well as the alternatives and risks of each were explained to the patient.  Written consent for procedure obtained. ?Time Out: Verified patient identification, verified procedure, site was marked, verified correct patient position, medications/allergies/relevent history reviewed.  The area was cleaned with alcohol swab.   ?  ?The left IT band was targeted for Extracorporeal shockwave therapy.  ?  ?Preset: trochanteric bursitis ?Power Level: 120 ?Frequency: 10 ?Impulse/cycles: 2000 ?Head size: large ?  ?Patient tolerated procedure well without immediate complications ?She is scheduled for an appointment on Monday and would like to keep this, but this will likely be her last session ? ? ?Ari[place] Constable, D.O.  ?PGY-4 [facility]  ?04/16/2021 9:11 AM ? ?Addendum:  I was the preceptor for this visit and available for immediate consultation.  Karlton Lemon MD [facility] ? ?

## 2021-04-19 ENCOUNTER — Ambulatory Visit (INDEPENDENT_AMBULATORY_CARE_PROVIDER_SITE_OTHER): Payer: Self-pay | Admitting: Family Medicine

## 2021-04-19 DIAGNOSIS — M7632 Iliotibial band syndrome, left leg: Secondary | ICD-10-CM

## 2021-04-19 NOTE — Progress Notes (Signed)
Patient returns today for 10th ECSW treatment for left IT band syndrome. ?Overall Jacqueline Macias continues to improve. ?Able to jog a mile/walk 2 miles, also walk on treadmill. ?Some soreness distally when doing this but none afterwards. ?Doing her home exercises and stretches. ? ?Procedure: ECSWT ?Indications:  left IT band syndrome ?  ?Procedure Details ?Consent: Risks of procedure as well as the alternatives and risks of each were explained to the patient.  Written consent for procedure obtained. ?Time Out: Verified patient identification, verified procedure, site was marked, verified correct patient position, medications/allergies/relevent history reviewed.  The area was cleaned with alcohol swab.   ?  ?The left IT band was targeted for Extracorporeal shockwave therapy.  ?  ?Preset: trochanteric bursitis ?Power Level: 120 ?Frequency: 10 ?Impulse/cycles: 2000 ?Head size: large ?  ?Patient tolerated procedure well without immediate complications.  Jacqueline Macias will follow up as needed. ? ?

## 2021-04-23 ENCOUNTER — Ambulatory Visit: Payer: 59 | Admitting: Sports Medicine

## 2021-05-24 ENCOUNTER — Other Ambulatory Visit: Payer: 59

## 2021-07-05 ENCOUNTER — Ambulatory Visit (INDEPENDENT_AMBULATORY_CARE_PROVIDER_SITE_OTHER): Payer: Self-pay | Admitting: Family Medicine

## 2021-07-05 DIAGNOSIS — M7632 Iliotibial band syndrome, left leg: Secondary | ICD-10-CM

## 2021-07-05 NOTE — Progress Notes (Signed)
Patient reports she was doing extremely well after last shockwave treatment without pain. [place] beach [place] 5 miles and starting about 1/4 mile in she felt pain lateral left knee and noticed some swelling. Felt similar to her IT band syndrome though. Admits she hadn't been doing her stretches regularly past few weeks. Would like shockwave treatment to the area again. On exam she does not have an effusion.  Exam benign other than mild distal IT band tenderness.  Procedure: ECSWT Indications:  left IT band syndrome   Procedure Details Consent: Risks of procedure as well as the alternatives and risks of each were explained to the patient.  Written consent for procedure obtained. Time Out: Verified patient identification, verified procedure, site was marked, verified correct patient position, medications/allergies/relevent history reviewed.  The area was cleaned with alcohol swab.     The left distal IT band was targeted for Extracorporeal shockwave therapy.    Preset: trochanteric bursitis Power Level: 120 Frequency: 12 Impulse/cycles: 2000 Head size: large   Patient tolerated procedure well without immediate complications.  She plans to return Friday for another treatment.

## 2021-07-09 ENCOUNTER — Ambulatory Visit: Payer: Self-pay | Admitting: Family Medicine

## 2021-07-09 VITALS — Ht 65.5 in

## 2021-07-09 DIAGNOSIS — M7632 Iliotibial band syndrome, left leg: Secondary | ICD-10-CM

## 2021-07-09 NOTE — Progress Notes (Signed)
   Jacqueline Macias is a 66 y.o. female who presents to [facility] today for the following:  Left IT band syndrome Patient presents today as the second in her restart of shockwave therapy, this was last performed throughout February and March and she had excellent results with this Doing better after first treatment this week   Procedure: ECSWT Indications:  Left IT band syndrome   Procedure Details Consent: Risks of procedure as well as the alternatives and risks of each were explained to the patient.  Written consent for procedure obtained. Time Out: Verified patient identification, verified procedure, site was marked, verified correct patient position, medications/allergies/relevent history reviewed.  The area was cleaned with alcohol swab.     The left IT band was targeted for Extracorporeal shockwave therapy.    Preset: trochanteric bursitis Power Level: 120 Frequency: 12  Impulse/cycles: 2000  Head size: large   Patient tolerated procedure well without immediate complications    [place] Constable, D.O.  PGY-4 [facility]  07/09/2021 8:34 AM

## 2021-07-09 NOTE — Progress Notes (Signed)
[facility]: Attending Note: I have reviewed the chart, discussed wit the Sports Medicine Fellow. I agree with assessment and treatment plan as detailed in the Fellow's note.  

## 2021-09-27 ENCOUNTER — Inpatient Hospital Stay: Admission: RE | Admit: 2021-09-27 | Payer: 59 | Source: Ambulatory Visit

## 2021-12-13 ENCOUNTER — Ambulatory Visit: Payer: Self-pay | Admitting: Family Medicine

## 2022-01-08 IMAGING — DX DG ABDOMEN 1V
1 series · 1 of 1 positions shown · non-contrast
Comparison: 11/30/2014

CLINICAL DATA: Vomiting

EXAM:
ABDOMEN - 1 VIEW

[abdomen kub]
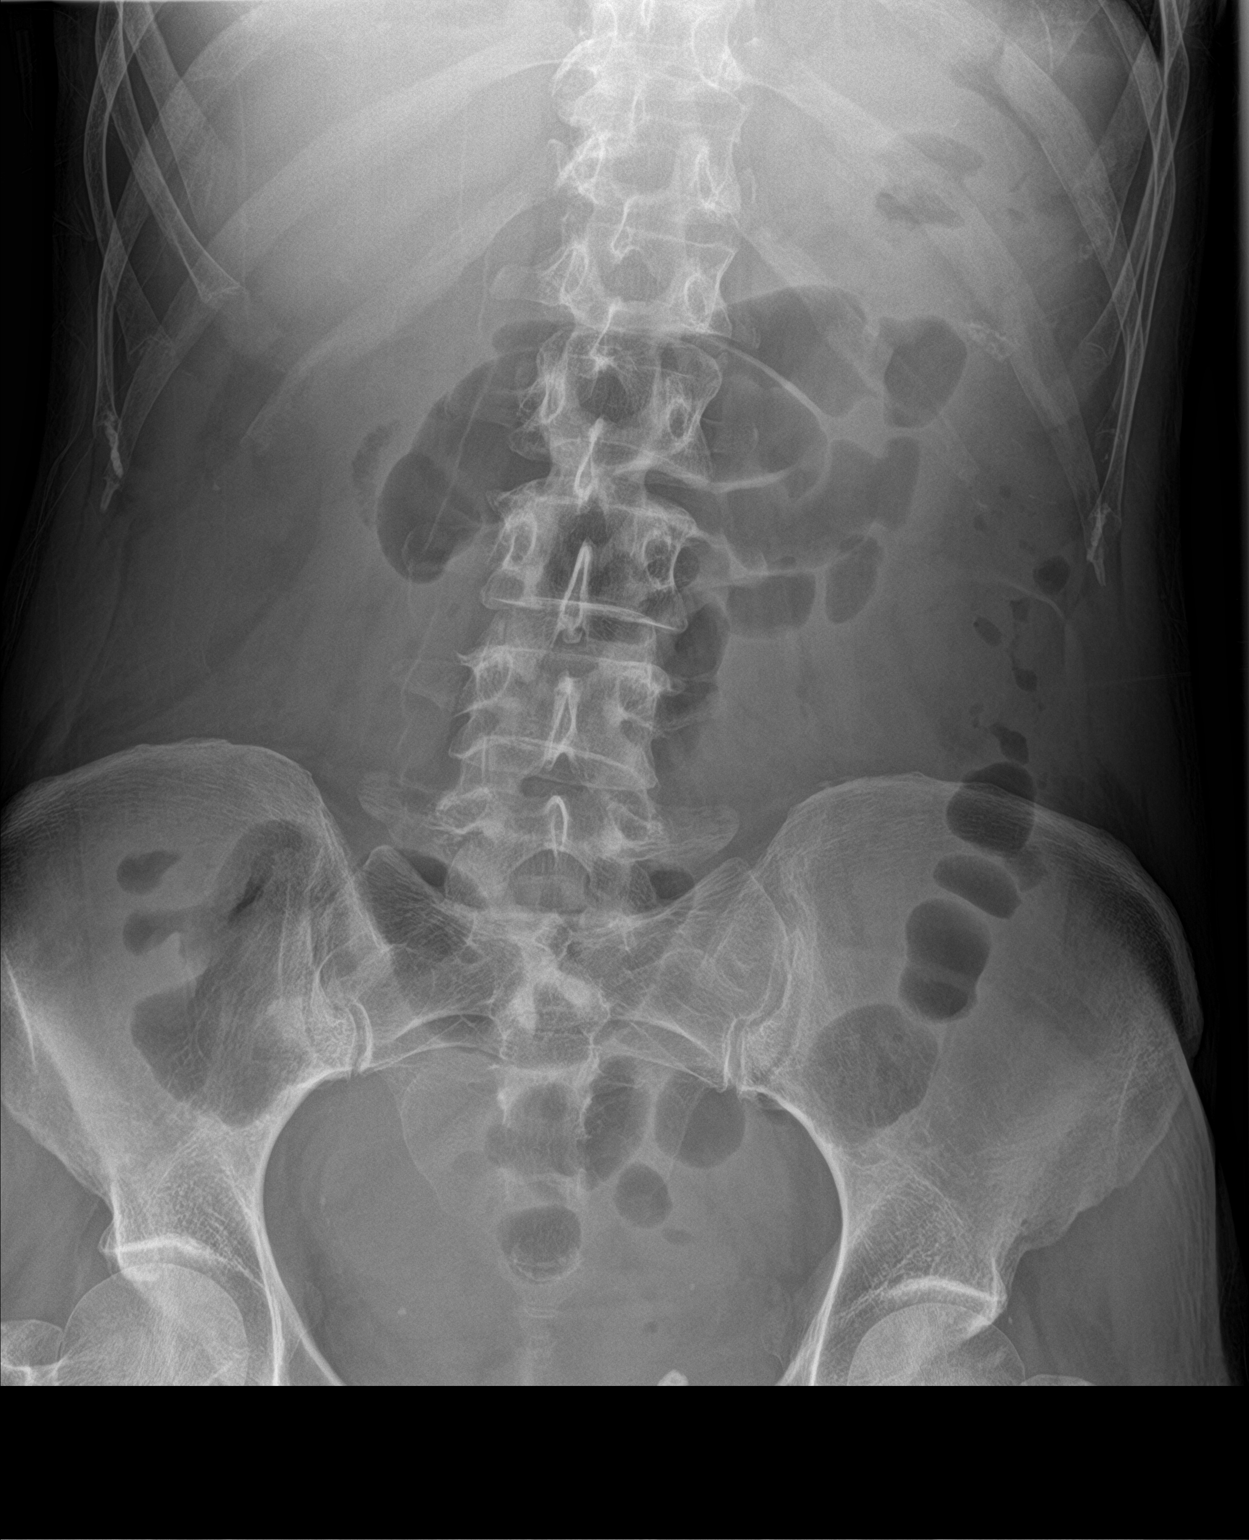

[1 of 1 positions shown; findings below may reference images not displayed]

FINDINGS: Mildly prominent central small bowel loops. Gas seen within
nondistended large bowel. Calcified phleboliths in the left pelvis.
No organomegaly or free air.
IMPRESSION: Mild gaseous distention of central small bowel loops, nonspecific.
This could reflect focal ileus or less likely early small bowel
obstruction.

## 2022-01-20 ENCOUNTER — Other Ambulatory Visit: Payer: Self-pay | Admitting: General Surgery

## 2022-01-20 DIAGNOSIS — Z803 Family history of malignant neoplasm of breast: Secondary | ICD-10-CM

## 2022-01-20 DIAGNOSIS — Z1239 Encounter for other screening for malignant neoplasm of breast: Secondary | ICD-10-CM

## 2022-02-18 ENCOUNTER — Ambulatory Visit
Admission: RE | Admit: 2022-02-18 | Discharge: 2022-02-18 | Disposition: A | Discharge status: Home / Self Care | Payer: 59 | Source: Ambulatory Visit | Attending: General Surgery | Admitting: General Surgery

## 2022-02-18 DIAGNOSIS — Z803 Family history of malignant neoplasm of breast: Secondary | ICD-10-CM

## 2022-02-18 DIAGNOSIS — Z1239 Encounter for other screening for malignant neoplasm of breast: Secondary | ICD-10-CM

## 2022-02-18 MED ORDER — GADOPICLENOL 0.5 MMOL/ML IV SOLN
6.0000 mL | Freq: Once | INTRAVENOUS | Status: AC | PRN
  Administered 2022-02-18: 6 mL via INTRAVENOUS

## 2022-03-30 ENCOUNTER — Inpatient Hospital Stay: Payer: 59

## 2022-03-30 ENCOUNTER — Other Ambulatory Visit: Payer: Self-pay

## 2022-03-30 ENCOUNTER — Encounter: Payer: Self-pay | Admitting: Genetic Counselor

## 2022-03-30 ENCOUNTER — Inpatient Hospital Stay: Payer: 59 | Attending: Genetic Counselor | Admitting: Genetic Counselor

## 2022-03-30 ENCOUNTER — Other Ambulatory Visit: Payer: Self-pay | Admitting: Genetic Counselor

## 2022-03-30 DIAGNOSIS — Z803 Family history of malignant neoplasm of breast: Secondary | ICD-10-CM

## 2022-03-30 DIAGNOSIS — Z7189 Other specified counseling: Secondary | ICD-10-CM | POA: Diagnosis not present

## 2022-03-30 DIAGNOSIS — Z87891 Personal history of nicotine dependence: Secondary | ICD-10-CM

## 2022-03-30 LAB — GENETIC SCREENING ORDER

## 2022-03-30 NOTE — Progress Notes (Signed)
REFERRING PROVIDER: Rolm Bookbinder, MD [address] [place],  [place] [id number]  PRIMARY PROVIDER:  Barbette Or, MD  PRIMARY REASON FOR VISIT:  1. Family history of breast cancer in female      HISTORY OF PRESENT ILLNESS:   Ms. Fonseca, a 67 y.o. female, was seen for a [facility] cancer genetics consultation at the request of Dr. Donne Hazel due to a family history of breast cancer.  Ms. Adcock presents to clinic today to discuss the possibility of a hereditary predisposition to cancer, genetic testing, and to further clarify her future cancer risks, as well as potential cancer risks for family members.    Ms. Dutrow is a 67 y.o. female with no personal history of cancer.  She has a family history of breast cancer, and a known PALB2 mutation in her niece.  This PALB2 mutation is coming from Ms. Daher sister-in-law's side of the family.  CANCER HISTORY:  Oncology History   No history exists.     RISK FACTORS:  Menarche was at age 62.  First live birth at age 35.  OCP use for approximately 0 years.  Ovaries intact: yes.  Hysterectomy: no.  Menopausal status: postmenopausal.  HRT use: 8 years. Colonoscopy: yes; abnormal. Mammogram within the last year: yes. Number of breast biopsies: 0. Up to date with pelvic exams: yes. Any excessive radiation exposure in the past: no  Past Medical History:  Diagnosis Date   C. difficile diarrhea    E. coli infection    Elevated transaminase level 11/24/2020    Past Surgical History:  Procedure Laterality Date   BREAST ENHANCEMENT SURGERY     CESAREAN SECTION     COLONOSCOPY  2007   Dr.Ganem Eagle normal exam per pt   COLONOSCOPY  02/28/2020   DILATION AND CURETTAGE OF UTERUS     polyp    Social History   Socioeconomic History   Marital status: Married    Spouse name: Not on file   Number of children: 2   Years of education: Not on file   Highest education level: Not on file  Occupational  History   Occupation: [employer]  Tobacco Use   Smoking status: Former    Packs/day: 0.50    Years: 5.00    Total pack years: 2.50    Types: Cigarettes    Quit date: 02/14/1977    Years since quitting: 45.1   Smokeless tobacco: Never  Vaping Use   Vaping Use: Never used  Substance and Sexual Activity   Alcohol use: Yes    Alcohol/week: 1.0 standard drink of alcohol    Types: 1 Glasses of wine per week   Drug use: Not Currently   Sexual activity: Not on file  Other Topics Concern   Not on file  Social History Narrative   Not on file   Social Determinants of Health   Financial Resource Strain: Not on file  Food Insecurity: Not on file  Transportation Needs: Not on file  Physical Activity: Not on file  Stress: Not on file  Social Connections: Not on file     FAMILY HISTORY:  We obtained a detailed, 4-generation family history.  Significant diagnoses are listed below: Family History  Problem Relation Age of Onset   Breast cancer Mother 37   Other Mother        TAH in 95s   Lymphoma Father 24   Diverticulitis Father    Lung cancer Brother  smoker   Breast cancer Maternal Aunt        dx. 76-78   Breast cancer Maternal Aunt 80   Heart attack Maternal Grandfather    Dementia Paternal Grandmother    Dementia Paternal Grandfather    Breast cancer Other    Breast cancer Niece 19       PALB2+ (from her mother's side)   Colon cancer Neg Hx    Esophageal cancer Neg Hx    Rectal cancer Neg Hx    Stomach cancer Neg Hx    Colon polyps Neg Hx      The patient has twin sons who are cancer free.  She had four brothers.  One who died young at 35.  Another died at 36 due to liver cancer.  His daughter has been diagnosed with breast cancer at 87 due to a PALB2 mutation she inherited from her mother.  The patient's parents are both deceased.  The patient's mother was diagnosed with breast cancer at 66 and died at 80.  Her mother had a brother and two sisters.   Both sisters were diagnosed with breast cancer over age 72.  One sister had a daughter with breast cancer.  The maternal grandparents are deceased.  The grandmother's mother was diagnosed with breast cancer.  The patient's father had lymphoma.  He had one sister who was cancer free.  His parents died in their 74's.  Ms. Lutts is unaware of previous family history of genetic testing for hereditary cancer risks. Patient's maternal ancestors are of [place] descent, and paternal ancestors are of [place] descent. There is no reported Ashkenazi Jewish ancestry. There is no known consanguinity.  GENETIC COUNSELING ASSESSMENT: Ms. Bobian is a 67 y.o. female with a family history of breast cancer which is somewhat suggestive of a hereditary cancer syndrome and predisposition to cancer given the number of women in the family with breast cancer. We, therefore, discussed and recommended the following at today's visit.   DISCUSSION: We discussed that, in general, most cancer is not inherited in families, but instead is sporadic or familial. Sporadic cancers occur by chance and typically happen at older ages (>50 years) as this type of cancer is caused by genetic changes acquired during an individual's lifetime. Some families have more cancers than would be expected by chance; however, the ages or types of cancer are not consistent with a known genetic mutation or known genetic mutations have been ruled out. This type of familial cancer is thought to be due to a combination of multiple genetic, environmental, hormonal, and lifestyle factors. While this combination of factors likely increases the risk of cancer, the exact source of this risk is not currently identifiable or testable.  We discussed that 5 - 10% of breast cancer is hereditary, with most cases associated with BRCA mutations.  There are other genes that can be associated with hereditary breast cancer syndromes.  These include ATM, CHEK2 and PALB2.  Ms.  Manchester had previous negative genetic testing in 2016.  We discussed that genetic testing now is more expansive and can test for hereditary mutations deep within a gene that could have been missed by performing DNA alone.  This testing would use RNA in additional to DNA.  We discussed that testing is beneficial for several reasons including knowing how to follow individuals after completing their treatment, identifying whether potential treatment options such as PARP inhibitors would be beneficial, and understand if other family members could be at risk for cancer and  allow them to undergo genetic testing.   We reviewed the characteristics, features and inheritance patterns of hereditary cancer syndromes. We also discussed genetic testing, including the appropriate family members to test, the process of testing, insurance coverage and turn-around-time for results. We discussed the implications of a negative, positive, carrier and/or variant of uncertain significant result. Ms. Gelb  was offered a common hereditary cancer panel (47 genes) and an expanded pan-cancer panel (77 genes). Ms. Furse was informed of the benefits and limitations of each panel, including that expanded pan-cancer panels contain genes that do not have clear management guidelines at this point in time.  We also discussed that as the number of genes included on a panel increases, the chances of variants of uncertain significance increases. Ms. Dowie decided to pursue genetic testing for the CancerNext-Expanded+RNAinsight gene panel.   The CancerNext-Expanded gene panel offered by [facility] and includes sequencing and rearrangement analysis for the following 77 genes: AIP, ALK, APC*, ATM*, AXIN2, BAP1, BARD1, BLM, BMPR1A, BRCA1*, BRCA2*, BRIP1*, CDC73, CDH1*, CDK4, CDKN1B, CDKN2A, CHEK2*, CTNNA1, DICER1, FANCC, FH, FLCN, GALNT12, KIF1B, LZTR1, MAX, MEN1, MET, MLH1*, MSH2*, MSH3, MSH6*, MUTYH*, NBN, NF1*, NF2, NTHL1, PALB2*, PHOX2B,  PMS2*, POT1, PRKAR1A, PTCH1, PTEN*, RAD51C*, RAD51D*, RB1, RECQL, RET, SDHA, SDHAF2, SDHB, SDHC, SDHD, SMAD4, SMARCA4, SMARCB1, SMARCE1, STK11, SUFU, TMEM127, TP53*, TSC1, TSC2, VHL and XRCC2 (sequencing and deletion/duplication); EGFR, EGLN1, HOXB13, KIT, MITF, PDGFRA, POLD1, and POLE (sequencing only); EPCAM and GREM1 (deletion/duplication only). DNA and RNA analyses performed for * genes.   Based on Ms. Friebel family history of cancer, she meets medical criteria for genetic testing. Despite that she meets criteria, she may still have an out of pocket cost. We discussed that if her out of pocket cost for testing is over $100, the laboratory will call and confirm whether she wants to proceed with testing.  If the out of pocket cost of testing is less than $100 she will be billed by the genetic testing laboratory.   We discussed that some people do not want to undergo genetic testing due to fear of genetic discrimination.  The Genetic Information Nondiscrimination Act (GINA) was signed into federal law in 2008. GINA prohibits health insurers and most employers from discriminating against individuals based on genetic information (including the results of genetic tests and family history information). According to GINA, health insurance companies cannot consider genetic information to be a preexisting condition, nor can they use it to make decisions regarding coverage or rates. GINA also makes it illegal for most employers to use genetic information in making decisions about hiring, firing, promotion, or terms of employment. It is important to note that GINA does not offer protections for life insurance, disability insurance, or long-term care insurance. GINA does not apply to those in the [employer], those who work for companies with less than 15 employees, and new life insurance or long-term disability insurance policies.  Health status due to a cancer diagnosis is not protected under GINA. More information  about GINA can be found by visiting [email or web address].   Based on the patient's family history, a statistical model (Tyrer Cusik) was used to estimate her risk of developing breast cancer. This estimates her lifetime risk of developing breast cancer to be approximately 17%. This estimation does not consider any genetic testing results.  The patient's lifetime breast cancer risk is a preliminary estimate based on available information using one of several models endorsed by the [place] (ACS). The ACS recommends consideration of breast MRI screening  as an adjunct to mammography for patients at high risk (defined as 20% or greater lifetime risk). Please note that a woman's breast cancer risk changes over time. It may increase or decrease based on age and any changes to the personal and/or family medical history. The risks and recommendations listed above apply to this patient at this point in time. In the future, she may or may not be eligible for the same medical management strategies and, in some cases, other medical management strategies may become available to her. If she is interested in an updated breast cancer risk assessment at a later date, she can contact us.      PLAN: After considering the risks, benefits, and limitations, Ms. Adger provided informed consent to pursue genetic testing and the blood sample was sent to [facility] for analysis of the CancerNext-Expanded+RNAinsight. Results should be available within approximately 2-3 weeks' time, at which point they will be disclosed by telephone to Ms. Roethlisberger, as will any additional recommendations warranted by these results. Ms. Bilinski will receive a summary of her genetic counseling visit and a copy of her results once available. This information will also be available in Epic.   Lastly, we encouraged Ms. Sprowl to remain in contact with cancer genetics annually so that we can continuously update the family  history and inform her of any changes in cancer genetics and testing that may be of benefit for this family.   Ms. Foos questions were answered to her satisfaction today. Our contact information was provided should additional questions or concerns arise. Thank you for the referral and allowing [place] to share in the care of your patient.   Porshea Janowski P. Florene Glen, [place], [facility] Licensed, Insurance risk surveyor [email or web address] phone: [phone]  The patient was seen for a total of 35 minutes in face-to-face genetic counseling.  The patient was seen alone.  Drs. Michell Heinrich, and/or Mercer were available for questions, if needed..    _______________________________________________________________________ For Office Staff:  Number of people involved in session: 1 Was an Intern/ student involved with case: yes

## 2022-04-12 ENCOUNTER — Telehealth: Payer: Self-pay | Admitting: Genetic Counselor

## 2022-04-12 NOTE — Telephone Encounter (Signed)
LM on VM that results are back and to please call.  Left CB instructions. 

## 2022-04-13 NOTE — Telephone Encounter (Signed)
LM on VM that results are back and to please call.  Left CB number.

## 2022-04-14 ENCOUNTER — Ambulatory Visit: Payer: Self-pay | Admitting: Genetic Counselor

## 2022-04-14 DIAGNOSIS — Z1379 Encounter for other screening for genetic and chromosomal anomalies: Secondary | ICD-10-CM

## 2022-04-14 NOTE — Telephone Encounter (Signed)
Revealed negative genetic testing.  Discussed that we do not know why there is cancer in the family. It could be due to a different gene that we are not testing, or maybe our current technology may not be able to pick something up.  It will be important for her to keep in contact with genetics to keep up with whether additional testing may be needed.   

## 2022-04-14 NOTE — Progress Notes (Signed)
HPI:  Ms. Nykaza was previously seen in the [facility] clinic due to a family history of breast cancer and concerns regarding a hereditary predisposition to cancer. Please refer to our prior cancer genetics clinic note for more information regarding our discussion, assessment and recommendations, at the time. Ms. Canchola recent genetic test results were disclosed to her, as were recommendations warranted by these results. These results and recommendations are discussed in more detail below.  CANCER HISTORY:  Oncology History   No history exists.    FAMILY HISTORY:  We obtained a detailed, 4-generation family history.  Significant diagnoses are listed below: Family History  Problem Relation Age of Onset   Breast cancer Mother 76   Other Mother        TAH in 27s   Lymphoma Father 27   Diverticulitis Father    Lung cancer Brother        smoker   Breast cancer Maternal Aunt        dx. 76-78   Breast cancer Maternal Aunt 80   Heart attack Maternal Grandfather    Dementia Paternal Grandmother    Dementia Paternal Grandfather    Breast cancer Other    Breast cancer Niece 57       PALB2+ (from her mother's side)   Colon cancer Neg Hx    Esophageal cancer Neg Hx    Rectal cancer Neg Hx    Stomach cancer Neg Hx    Colon polyps Neg Hx      The patient has twin sons who are cancer free.  She had four brothers.  One who died young at 38.  Another died at 39 due to liver cancer.  His daughter has been diagnosed with breast cancer at 79 due to a PALB2 mutation she inherited from her mother.  The patient's parents are both deceased.   The patient's mother was diagnosed with breast cancer at 29 and died at 75.  Her mother had a brother and two sisters.  Both sisters were diagnosed with breast cancer over age 104.  One sister had a daughter with breast cancer.  The maternal grandparents are deceased.  The grandmother's mother was diagnosed with breast cancer.   The patient's  father had lymphoma.  He had one sister who was cancer free.  His parents died in their 68's.   Ms. Graboski is unaware of previous family history of genetic testing for hereditary cancer risks. Patient's maternal ancestors are of [place] descent, and paternal ancestors are of [place] descent. There is no reported Ashkenazi Jewish ancestry. There is no known consanguinity  GENETIC TEST RESULTS: Genetic testing reported out on April 11, 2022 through the CancerNext-Expanded+RNAinsight cancer panel found no pathogenic mutations. The CancerNext-Expanded gene panel offered by [facility] and includes sequencing and rearrangement analysis for the following 77 genes: AIP, ALK, APC*, ATM*, AXIN2, BAP1, BARD1, BMPR1A, BRCA1*, BRCA2*, BRIP1*, CDC73, CDH1*, CDK4, CDKN1B, CDKN2A, CHEK2*, CTNNA1, DICER1, FH, FLCN, KIF1B, LZTR1, MAX, MEN1, MET, MLH1*, MSH2*, MSH3, MSH6*, MUTYH*, NF1*, NF2, NTHL1, PALB2*, PHOX2B, PMS2*, POT1, PRKAR1A, PTCH1, PTEN*, RAD51C*, RAD51D*, RB1, RET, SDHA, SDHAF2, SDHB, SDHC, SDHD, SMAD4, SMARCA4, SMARCB1, SMARCE1, STK11, SUFU, TMEM127, TP53*, TSC1, TSC2, and VHL (sequencing and deletion/duplication); EGFR, EGLN1, HOXB13, KIT, MITF, PDGFRA, POLD1, and POLE (sequencing only); EPCAM and GREM1 (deletion/duplication only). DNA and RNA analyses performed for * genes. The test report has been scanned into EPIC and is located under the Molecular Pathology section of the Results Review tab.  A portion  of the result report is included below for reference.     We discussed with Ms. Cossairt that because current genetic testing is not perfect, it is possible there may be a gene mutation in one of these genes that current testing cannot detect, but that chance is small.  We also discussed, that there could be another gene that has not yet been discovered, or that we have not yet tested, that is responsible for the cancer diagnoses in the family. It is also possible there is a hereditary cause for the cancer  in the family that Ms. Pecina did not inherit and therefore was not identified in her testing.  Therefore, it is important to remain in touch with cancer genetics in the future so that we can continue to offer Ms. Justo the most up to date genetic testing.   ADDITIONAL GENETIC TESTING: We discussed with Ms. Fulp that her genetic testing was fairly extensive.  If there are genes identified to increase cancer risk that can be analyzed in the future, we would be happy to discuss and coordinate this testing at that time.    CANCER SCREENING RECOMMENDATIONS: Ms. Panik test result is considered negative (normal).  This means that we have not identified a hereditary cause for her family history of breast cancer at this time. Most cancers happen by chance and this negative test suggests that her cancer may fall into this category.    While reassuring, this does not definitively rule out a hereditary predisposition to cancer. It is still possible that there could be genetic mutations that are undetectable by current technology. There could be genetic mutations in genes that have not been tested or identified to increase cancer risk.  Therefore, it is recommended she continue to follow the cancer management and screening guidelines provided by her primary healthcare provider.   An individual's cancer risk and medical management are not determined by genetic test results alone. Overall cancer risk assessment incorporates additional factors, including personal medical history, family history, and any available genetic information that may result in a personalized plan for cancer prevention and surveillance.  Based on the patient's family history, a statistical model (Tyrer Cusik) was used to estimate her risk of developing breast cancer. This estimates her lifetime risk of developing breast cancer to be approximately 17%. The patient's lifetime breast cancer risk is a preliminary estimate based on available  information using one of several models endorsed by the [place] (ACS). The ACS recommends consideration of breast MRI screening as an adjunct to mammography for patients at high risk (defined as 20% or greater lifetime risk). Please note that a woman's breast cancer risk changes over time. It may increase or decrease based on age and any changes to the personal and/or family medical history. The risks and recommendations listed above apply to this patient at this point in time. In the future, she may or may not be eligible for the same medical management strategies and, in some cases, other medical management strategies may become available to her. If she is interested in an updated breast cancer risk assessment at a later date, she can contact us.  At this time, Ms. Devendorf is not considered to be at high risk for breast cancer, although her risk is above that of the general population.  While she is not at a risk where ACS recommends breast MRI screening, it could be considered if needed based on other risk factors including, but not limited to, breast density.  RECOMMENDATIONS FOR FAMILY MEMBERS:  Individuals in this family might be at some increased risk of developing cancer, over the general population risk, simply due to the family history of cancer.  We recommended women in this family have a yearly mammogram beginning at age 84, or 7 years younger than the earliest onset of cancer, an annual clinical breast exam, and perform monthly breast self-exams. Women in this family should also have a gynecological exam as recommended by their primary provider. All family members should be referred for colonoscopy starting at age 67.  It is also possible there is a hereditary cause for the cancer in Ms. Fortman family that she did not inherit and therefore was not identified in her.  Based on Ms. Sayarath family history, we recommended her maternal cousin, Juliann Pulse, who was diagnosed  with breast cancer at age 3, have genetic counseling and testing. Ms. Wrightsman will let us know if we can be of any assistance in coordinating genetic counseling and/or testing for this family member.   FOLLOW-UP: Lastly, we discussed with Ms. Dudzinski that cancer genetics is a rapidly advancing field and it is possible that new genetic tests will be appropriate for her and/or her family members in the future. We encouraged her to remain in contact with cancer genetics on an annual basis so we can update her personal and family histories and let her know of advances in cancer genetics that may benefit this family.   Our contact number was provided. Ms. Cunico questions were answered to her satisfaction, and she knows she is welcome to call us at anytime with additional questions or concerns.   Roma Kayser, [place], [facility] Licensed, Certified Genetic Counselor [email or web address]

## 2022-11-14 ENCOUNTER — Encounter: Payer: Self-pay | Admitting: Gastroenterology

## 2022-12-06 ENCOUNTER — Ambulatory Visit: Payer: Self-pay

## 2022-12-09 ENCOUNTER — Ambulatory Visit (AMBULATORY_SURGERY_CENTER): Payer: BC Managed Care – PPO

## 2022-12-09 VITALS — Ht 66.5 in | Wt 123.0 lb

## 2022-12-09 DIAGNOSIS — Z860100 Personal history of colon polyps, unspecified: Secondary | ICD-10-CM

## 2022-12-09 MED ORDER — NA SULFATE-K SULFATE-MG SULF 17.5-3.13-1.6 GM/177ML PO SOLN
1.0000 | Freq: Once | ORAL | 0 refills | Status: AC
Start: 2022-12-09 — End: 2022-12-09

## 2022-12-09 NOTE — Progress Notes (Signed)
Pre visit completed via phone call; Patient verified name, DOB, and address; No egg or soy allergy known to patient;  No issues known to pt with past sedation with any surgeries or procedures; Patient denies ever being told they had issues or difficulty with intubation; No FH of Malignant Hyperthermia; Pt is not on diet pills; Pt is not on home 02;  Pt is not on blood thinners;  Pt denies issues with constipation;  No A fib or A flutter; Have any cardiac testing pending--NO Insurance verified during PV appt--- BCBS Pt can ambulate without assistance;  Pt denies use of chewing tobacco; Discussed diabetic/weight loss medication holds; Discussed NSAID holds; Checked BMI to be less than 50; Pt instructed to use Singlecare.com or GoodRx for a price reduction on prep;  Patient's chart reviewed by Cathlyn Parsons CNRA prior to previsit and patient appropriate for the [facility];  Pre visit completed and red dot placed by patient's name on their procedure day (on provider's schedule);  Instructions sent to patient via MyChart as well as printed and mailed to the patient per her request;

## 2022-12-29 ENCOUNTER — Encounter: Payer: Self-pay | Admitting: Gastroenterology

## 2022-12-29 ENCOUNTER — Encounter: Payer: 59 | Admitting: Gastroenterology

## 2023-01-09 ENCOUNTER — Encounter: Payer: 59 | Admitting: Gastroenterology

## 2023-01-10 ENCOUNTER — Encounter: Payer: Self-pay | Admitting: Gastroenterology

## 2023-01-10 ENCOUNTER — Ambulatory Visit: Payer: BC Managed Care – PPO | Admitting: Gastroenterology

## 2023-01-10 VITALS — BP 111/69 | HR 50 | Temp 97.5°F | Resp 9 | Ht 66.5 in | Wt 123.0 lb

## 2023-01-10 DIAGNOSIS — Q439 Congenital malformation of intestine, unspecified: Secondary | ICD-10-CM | POA: Diagnosis not present

## 2023-01-10 DIAGNOSIS — Z1211 Encounter for screening for malignant neoplasm of colon: Secondary | ICD-10-CM | POA: Diagnosis present

## 2023-01-10 DIAGNOSIS — K56609 Unspecified intestinal obstruction, unspecified as to partial versus complete obstruction: Secondary | ICD-10-CM

## 2023-01-10 DIAGNOSIS — K5730 Diverticulosis of large intestine without perforation or abscess without bleeding: Secondary | ICD-10-CM

## 2023-01-10 DIAGNOSIS — D123 Benign neoplasm of transverse colon: Secondary | ICD-10-CM | POA: Diagnosis not present

## 2023-01-10 DIAGNOSIS — Z860101 Personal history of adenomatous and serrated colon polyps: Secondary | ICD-10-CM | POA: Diagnosis not present

## 2023-01-10 DIAGNOSIS — Z9889 Other specified postprocedural states: Secondary | ICD-10-CM

## 2023-01-10 DIAGNOSIS — Z860100 Personal history of colon polyps, unspecified: Secondary | ICD-10-CM

## 2023-01-10 MED ORDER — SODIUM CHLORIDE 0.9 % IV SOLN: 500.0000 mL | Freq: Once | INTRAVENOUS | Status: DC

## 2023-01-10 NOTE — Progress Notes (Signed)
Pt's states no medical or surgical changes since previsit or office visit. 

## 2023-01-10 NOTE — Progress Notes (Signed)
Called to room to assist during endoscopic procedure.  Patient ID and intended procedure confirmed with present staff. Received instructions for my participation in the procedure from the performing physician.  

## 2023-01-10 NOTE — Op Note (Signed)
[facility] Patient Name: Jacqueline Macias Procedure Date: 01/10/2023 7:19 AM MRN: [id number] Endoscopist: Viviann Spare P. Adela Lank , MD, [id number] Age: 67 Referring MD:  Date of Birth: January 06, 1956 Gender: Female Account #: [id number] Procedure:                Colonoscopy Indications:              High risk colon cancer surveillance: Personal                            history of colonic polyps - large ascending colon                            polyp (35mm) removed piecemeal 02/2020 Medicines:                Monitored Anesthesia Care Procedure:                Pre-Anesthesia Assessment:                           - Prior to the procedure, a History and Physical                            was performed, and patient medications and                            allergies were reviewed. The patient's tolerance of                            previous anesthesia was also reviewed. The risks                            and benefits of the procedure and the sedation                            options and risks were discussed with the patient.                            All questions were answered, and informed consent                            was obtained. Prior Anticoagulants: The patient has                            taken no anticoagulant or antiplatelet agents. ASA                            Grade Assessment: II - A patient with mild systemic                            disease. After reviewing the risks and benefits,                            the patient was deemed in satisfactory condition to  undergo the procedure.                           After obtaining informed consent, the colonoscope                            was passed under direct vision. Throughout the                            procedure, the patient's blood pressure, pulse, and                            oxygen saturations were monitored continuously.The                            colonoscopy was  technically difficult and complex                            due to restricted mobility of the colon. The                            patient tolerated the procedure well. The quality                            of the bowel preparation was good. The ileocecal                            valve, appendiceal orifice, and rectum were                            photographed. The Olympus Scope [id number] was                            introduced through the anus and advanced to the the                            cecum, identified by appendiceal orifice and                            ileocecal valve. Scope In: 8:01:40 AM Scope Out: 8:28:49 AM Scope Withdrawal Time: 0 hours 17 minutes 24 seconds  Total Procedure Duration: 0 hours 27 minutes 9 seconds  Findings:                 The perianal and digital rectal examinations were                            normal.                           A large post polypectomy scar was found in the                            ascending colon near tattoo. The scar tissue was  healthy in appearance. There was no evidence of the                            previous polyp.                           Four sessile polyps were found in the transverse                            colon. The polyps were diminutive in size. These                            polyps were removed with a cold snare. Resection                            and retrieval were complete.                           A few small-mouthed diverticula were found in the                            sigmoid colon.                           The colon was quite tortuous with restricted                            mobility of the rectosigmoid and distal sigmoid                            colon. Pediatric colonoscope used for this exam                            although I think Ultraslim pediatric colonoscope                            would be best option for her in the future. Water                             immersion used to traverse the distal left colon                            which was difficult.                           The exam was otherwise without abnormality. Complications:            No immediate complications. Estimated blood loss:                            Minimal. Estimated Blood Loss:     Estimated blood loss was minimal. Impression:               - Post-polypectomy scar in the ascending colon.                           -  Four diminutive polyps in the transverse colon,                            removed with a cold snare. Resected and retrieved.                           - Diverticulosis in the sigmoid colon.                           - Tortuous colon with restricted mobility of the                            left colon which prolonged this exam.                           - The examination was otherwise normal. Recommendation:           - Patient has a contact number available for                            emergencies. The signs and symptoms of potential                            delayed complications were discussed with the                            patient. Return to normal activities tomorrow.                            Written discharge instructions were provided to the                            patient.                           - Resume previous diet.                           - Continue present medications.                           - Await pathology results. Viviann Spare P. Ambrie Carte, MD 01/10/2023 8:36:15 AM This report has been signed electronically.

## 2023-01-10 NOTE — Progress Notes (Signed)
[facility] History and Physical   Primary Care Physician:  Sunnie Nielsen, MD   Reason for Procedure:   History of colon polyps  Plan:    colonoscopy     HPI: Jacqueline Macias is a 67 y.o. female  here for colonoscopy surveillance - history of advanced SSP -35mm removed 1/22. Here for surveillance exam.   Patient denies any bowel symptoms at this time. No family history of colon cancer known. Otherwise feels well without any cardiopulmonary symptoms.   I have discussed risks / benefits of anesthesia and endoscopic procedure with [name] and they wish to proceed with the exams as outlined today.    Past Medical History:  Diagnosis Date   C. difficile diarrhea    E. coli infection 11/2020   Elevated transaminase level 11/24/2020    Past Surgical History:  Procedure Laterality Date   BREAST ENHANCEMENT SURGERY     CESAREAN SECTION     COLONOSCOPY  2007   Dr.Ganem Eagle normal exam per pt   COLONOSCOPY  10/2020   HD-MAC-2 day suprep(good)-2 yr recall   DILATION AND CURETTAGE OF UTERUS     polyp    Prior to Admission medications   Medication Sig Start Date End Date Taking? Authorizing Provider  alendronate (FOSAMAX) 70 MG tablet Take 70 mg by mouth once a week. 11/25/19  Yes [provider]  ALPRAZolam Prudy Feeler) 0.5 MG tablet Take 0.5 mg by mouth at bedtime as needed for anxiety or sleep.   Yes [provider]  Ascorbic Acid (VITAMIN C) 1000 MG tablet Take 2,000 mg by mouth daily.   Yes [provider]  Calcium Carb-Cholecalciferol (CALCIUM PLUS VITAMIN D PO) Take 2 tablets by mouth daily at 6 (six) AM.   Yes [provider]  Cholecalciferol (VITAMIN D-3 PO) Take 1 tablet by mouth daily.   Yes [provider]  fluticasone (FLONASE) 50 MCG/ACT nasal spray Place 2 sprays into both nostrils daily. Patient taking differently: Place 2 sprays into both nostrils daily as needed for allergies. 05/06/16   Yes Barnett Abu, [place] D, PA-C  MAGNESIUM COMPLEX HIGH POTENCY PO Take 2 tablets by mouth daily at 6 (six) AM.   Yes [provider]  Omega-3 Fatty Acids (FISH OIL PO) Take 2 capsules by mouth daily.   Yes [provider]  Probiotic Product (PROBIOTIC ADVANCED PO) Take 1 capsule by mouth daily.   Yes [provider]  zolpidem (AMBIEN) 5 MG tablet Take 5 mg by mouth at bedtime as needed.   Yes [provider]    Current Outpatient Medications  Medication Sig Dispense Refill   alendronate (FOSAMAX) 70 MG tablet Take 70 mg by mouth once a week.     ALPRAZolam (XANAX) 0.5 MG tablet Take 0.5 mg by mouth at bedtime as needed for anxiety or sleep.     Ascorbic Acid (VITAMIN C) 1000 MG tablet Take 2,000 mg by mouth daily.     Calcium Carb-Cholecalciferol (CALCIUM PLUS VITAMIN D PO) Take 2 tablets by mouth daily at 6 (six) AM.     Cholecalciferol (VITAMIN D-3 PO) Take 1 tablet by mouth daily.     fluticasone (FLONASE) 50 MCG/ACT nasal spray Place 2 sprays into both nostrils daily. (Patient taking differently: Place 2 sprays into both nostrils daily as needed for allergies.) 16 g 0   MAGNESIUM COMPLEX HIGH POTENCY PO Take 2 tablets by mouth daily at 6 (six) AM.     Omega-3 Fatty Acids (FISH  OIL PO) Take 2 capsules by mouth daily.     Probiotic Product (PROBIOTIC ADVANCED PO) Take 1 capsule by mouth daily.     zolpidem (AMBIEN) 5 MG tablet Take 5 mg by mouth at bedtime as needed.     Current Facility-Administered Medications  Medication Dose Route Frequency Provider Last Rate Last Admin   0.9 %  sodium chloride infusion  500 mL Intravenous Once Fynn Vanblarcom, Willaim Rayas, MD        Allergies as of 01/10/2023   (No Known Allergies)    Family History  Problem Relation Age of Onset   Breast cancer Mother 72   Other Mother        TAH in 50s   Lymphoma Father 65   Diverticulitis Father    Lung cancer Brother        smoker   Breast cancer Maternal Aunt        dx.  76-78   Breast cancer Maternal Aunt 80   Heart attack Maternal Grandfather    Dementia Paternal Grandmother    Dementia Paternal Grandfather    Breast cancer Other    Breast cancer Niece 57       PALB2+ (from her mother's side)   Colon cancer Neg Hx    Esophageal cancer Neg Hx    Rectal cancer Neg Hx    Stomach cancer Neg Hx    Colon polyps Neg Hx     Social History   Socioeconomic History   Marital status: Married    Spouse name: Not on file   Number of children: 2   Years of education: Not on file   Highest education level: Not on file  Occupational History   Occupation: [employer]  Tobacco Use   Smoking status: Former    Current packs/day: 0.00    Average packs/day: 0.5 packs/day for 5.0 years (2.5 ttl pk-yrs)    Types: Cigarettes    Start date: 02/15/1972    Quit date: 02/14/1977    Years since quitting: 45.9   Smokeless tobacco: Never  Vaping Use   Vaping status: Never Used  Substance and Sexual Activity   Alcohol use: Yes    Alcohol/week: 2.0 standard drinks of alcohol    Types: 2 Glasses of wine per week   Drug use: Not Currently   Sexual activity: Not on file  Other Topics Concern   Not on file  Social History Narrative   Not on file   Social Determinants of Health   Financial Resource Strain: Low Risk  (08/28/2022)   Received from [facility]   Overall Financial Resource Strain (CARDIA)    Difficulty of Paying Living Expenses: Not hard at all  Food Insecurity: No Food Insecurity (08/28/2022)   Received from [facility]   Hunger Vital Sign    Worried About Running Out of Food in the Last Year: Never true    Ran Out of Food in the Last Year: Never true  Transportation Needs: No Transportation Needs (08/28/2022)   Received from [facility] - Transportation    Lack of Transportation (Medical): No    Lack of Transportation (Non-Medical): No  Physical Activity: Sufficiently Active (08/28/2022)   Received from [facility]    Exercise Vital Sign    Days of Exercise per Week: 6 days    Minutes of Exercise per Session: 90 min  Stress: No Stress Concern Present (08/28/2022)   Received from [facility] - Occupational Stress Questionnaire    Feeling of Stress : Not at all  Social Connections: Socially Integrated (08/28/2022)   Received from [facility]   Social Network    How would you rate your social network (family, work, friends)?: Good participation with social networks  Intimate Partner Violence: Not At Risk (08/28/2022)   Received from [facility]   HITS    Over the last 12 months how often did your partner physically hurt you?: Never    Over the last 12 months how often did your partner insult you or talk down to you?: Never    Over the last 12 months how often did your partner threaten you with physical harm?: Never    Over the last 12 months how often did your partner scream or curse at you?: Never    Review of Systems: All other review of systems negative except as mentioned in the HPI.  Physical Exam: Vital signs BP (!) 151/95   Pulse 78   Temp (!) 97.5 F (36.4 C)   Ht 5' 6.5" (1.689 m)   Wt 123 lb (55.8 kg)   SpO2 98%   BMI 19.56 kg/m   General:   Alert,  Well-developed, pleasant and cooperative in NAD Lungs:  Clear throughout to auscultation.   Heart:  Regular rate and rhythm Abdomen:  Soft, nontender and nondistended.   Neuro/Psych:  Alert and cooperative. Normal mood and affect. A and O x 3  Harlin Rain, MD [facility]

## 2023-01-10 NOTE — Progress Notes (Signed)
Sedate, gd SR, tolerated procedure well, VSS, report to RN 

## 2023-01-10 NOTE — Patient Instructions (Signed)
Resume previous diet and medications. Awaiting pathology results. Repeat Colonoscopy date to be determined based on pathology results.  YOU HAD AN ENDOSCOPIC PROCEDURE TODAY AT THE  ENDOSCOPY CENTER:   Refer to the procedure report that was given to you for any specific questions about what was found during the examination.  If the procedure report does not answer your questions, please call your gastroenterologist to clarify.  If you requested that your care partner not be given the details of your procedure findings, then the procedure report has been included in a sealed envelope for you to review at your convenience later.  YOU SHOULD EXPECT: Some feelings of bloating in the abdomen. Passage of more gas than usual.  Walking can help get rid of the air that was put into your GI tract during the procedure and reduce the bloating. If you had a lower endoscopy (such as a colonoscopy or flexible sigmoidoscopy) you may notice spotting of blood in your stool or on the toilet paper. If you underwent a bowel prep for your procedure, you may not have a normal bowel movement for a few days.  Please Note:  You might notice some irritation and congestion in your nose or some drainage.  This is from the oxygen used during your procedure.  There is no need for concern and it should clear up in a day or so.  SYMPTOMS TO REPORT IMMEDIATELY:  Following lower endoscopy (colonoscopy or flexible sigmoidoscopy):  Excessive amounts of blood in the stool  Significant tenderness or worsening of abdominal pains  Swelling of the abdomen that is new, acute  Fever of 100F or higher  For urgent or emergent issues, a gastroenterologist can be reached at any hour by calling [phone]. Do not use MyChart messaging for urgent concerns.    DIET:  We do recommend a small meal at first, but then you may proceed to your regular diet.  Drink plenty of fluids but you should avoid alcoholic beverages for 24  hours.  ACTIVITY:  You should plan to take it easy for the rest of today and you should NOT DRIVE or use heavy machinery until tomorrow (because of the sedation medicines used during the test).    FOLLOW UP: Our staff will call the number listed on your records the next business day following your procedure.  We will call around 7:15- 8:00 am to check on you and address any questions or concerns that you may have regarding the information given to you following your procedure. If we do not reach you, we will leave a message.     If any biopsies were taken you will be contacted by phone or by letter within the next 1-3 weeks.  Please call us at [phone] if you have not heard about the biopsies in 3 weeks.    SIGNATURES/CONFIDENTIALITY: You and/or your care partner have signed paperwork which will be entered into your electronic medical record.  These signatures attest to the fact that that the information above on your After Visit Summary has been reviewed and is understood.  Full responsibility of the confidentiality of this discharge information lies with you and/or your care-partner.

## 2023-01-11 ENCOUNTER — Telehealth: Payer: Self-pay | Admitting: *Deleted

## 2023-01-11 NOTE — Telephone Encounter (Signed)
Attempted to call patient for their post-procedure follow-up call. No answer. Left voicemail.   

## 2023-01-13 LAB — SURGICAL PATHOLOGY

## 2023-03-10 ENCOUNTER — Other Ambulatory Visit: Payer: Self-pay | Admitting: General Surgery

## 2023-03-10 DIAGNOSIS — Z1239 Encounter for other screening for malignant neoplasm of breast: Secondary | ICD-10-CM

## 2023-03-10 DIAGNOSIS — Z803 Family history of malignant neoplasm of breast: Secondary | ICD-10-CM

## 2023-06-20 ENCOUNTER — Other Ambulatory Visit

## 2023-06-23 ENCOUNTER — Ambulatory Visit
Admission: RE | Admit: 2023-06-23 | Discharge: 2023-06-23 | Disposition: A | Discharge status: Home / Self Care | Source: Ambulatory Visit | Attending: General Surgery | Admitting: General Surgery

## 2023-06-23 DIAGNOSIS — Z803 Family history of malignant neoplasm of breast: Secondary | ICD-10-CM

## 2023-06-23 DIAGNOSIS — Z1239 Encounter for other screening for malignant neoplasm of breast: Secondary | ICD-10-CM

## 2023-06-23 MED ORDER — GADOPICLENOL 0.5 MMOL/ML IV SOLN
6.0000 mL | Freq: Once | INTRAVENOUS | Status: AC | PRN
  Administered 2023-06-23: 6 mL via INTRAVENOUS

## 2023-11-09 ENCOUNTER — Other Ambulatory Visit: Payer: Self-pay | Admitting: General Surgery

## 2023-11-09 ENCOUNTER — Encounter: Payer: Self-pay | Admitting: General Surgery

## 2023-11-09 DIAGNOSIS — Z803 Family history of malignant neoplasm of breast: Secondary | ICD-10-CM

## 2024-01-17 ENCOUNTER — Inpatient Hospital Stay
Admission: RE | Admit: 2024-01-17 | Discharge: 2024-01-17 | Disposition: A | Source: Ambulatory Visit | Attending: General Surgery | Admitting: General Surgery

## 2024-01-17 DIAGNOSIS — Z803 Family history of malignant neoplasm of breast: Secondary | ICD-10-CM

## 2024-01-17 MED ORDER — IOPAMIDOL (ISOVUE-370) INJECTION 76%
100.0000 mL | Freq: Once | INTRAVENOUS | Status: AC | PRN
  Administered 2024-01-17: 100 mL via INTRAVENOUS

## 2024-01-31 ENCOUNTER — Encounter
